# Patient Record
Sex: Female | Born: 2000 | Race: Black or African American | Hispanic: No | Marital: Single | Smoking: Never smoker
Health system: Southern US, Community
[De-identification: ages and names within clinical notes are randomized; demographics above are authoritative.]

## PROBLEM LIST (undated history)

## (undated) ENCOUNTER — Inpatient Hospital Stay (HOSPITAL_COMMUNITY): Payer: Self-pay

## (undated) DIAGNOSIS — L0291 Cutaneous abscess, unspecified: Secondary | ICD-10-CM

## (undated) DIAGNOSIS — E109 Type 1 diabetes mellitus without complications: Secondary | ICD-10-CM

## (undated) DIAGNOSIS — E119 Type 2 diabetes mellitus without complications: Secondary | ICD-10-CM

## (undated) HISTORY — PX: CHOLECYSTECTOMY: SHX55

---
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
## 2022-04-06 ENCOUNTER — Encounter (INDEPENDENT_AMBULATORY_CARE_PROVIDER_SITE_OTHER): Payer: Self-pay

## 2022-09-04 ENCOUNTER — Encounter (HOSPITAL_COMMUNITY): Payer: Self-pay | Admitting: Emergency Medicine

## 2022-09-04 ENCOUNTER — Emergency Department (HOSPITAL_COMMUNITY)
Admission: EM | Admit: 2022-09-04 | Discharge: 2022-09-04 | Disposition: A | Discharge status: Home / Self Care | Payer: Medicaid Other | Attending: Emergency Medicine | Admitting: Emergency Medicine

## 2022-09-04 ENCOUNTER — Other Ambulatory Visit: Payer: Self-pay

## 2022-09-04 ENCOUNTER — Emergency Department (HOSPITAL_COMMUNITY): Payer: Medicaid Other

## 2022-09-04 DIAGNOSIS — O209 Hemorrhage in early pregnancy, unspecified: Secondary | ICD-10-CM | POA: Diagnosis not present

## 2022-09-04 DIAGNOSIS — J45909 Unspecified asthma, uncomplicated: Secondary | ICD-10-CM | POA: Diagnosis not present

## 2022-09-04 DIAGNOSIS — E119 Type 2 diabetes mellitus without complications: Secondary | ICD-10-CM | POA: Diagnosis not present

## 2022-09-04 DIAGNOSIS — O039 Complete or unspecified spontaneous abortion without complication: Secondary | ICD-10-CM | POA: Insufficient documentation

## 2022-09-04 DIAGNOSIS — O3480 Maternal care for other abnormalities of pelvic organs, unspecified trimester: Secondary | ICD-10-CM | POA: Diagnosis not present

## 2022-09-04 DIAGNOSIS — Z3A00 Weeks of gestation of pregnancy not specified: Secondary | ICD-10-CM | POA: Diagnosis not present

## 2022-09-04 LAB — CBC
HCT: 35.9 % — ABNORMAL LOW (ref 36.0–46.0)
Hemoglobin: 11.3 g/dL — ABNORMAL LOW (ref 12.0–15.0)
MCH: 24.9 pg — ABNORMAL LOW (ref 26.0–34.0)
MCHC: 31.5 g/dL (ref 30.0–36.0)
MCV: 79.2 fL — ABNORMAL LOW (ref 80.0–100.0)
Platelets: 423 10*3/uL — ABNORMAL HIGH (ref 150–400)
RBC: 4.53 MIL/uL (ref 3.87–5.11)
RDW: 13.8 % (ref 11.5–15.5)
WBC: 10.6 10*3/uL — ABNORMAL HIGH (ref 4.0–10.5)
nRBC: 0 % (ref 0.0–0.2)

## 2022-09-04 LAB — BASIC METABOLIC PANEL
Anion gap: 11 (ref 5–15)
BUN: <5 mg/dL — ABNORMAL LOW (ref 6–20)
CO2: 22 mmol/L (ref 22–32)
Calcium: 9.1 mg/dL (ref 8.9–10.3)
Chloride: 100 mmol/L (ref 98–111)
Creatinine, Ser: 0.56 mg/dL (ref 0.44–1.00)
GFR, Estimated: >60 mL/min (ref >60)
Glucose, Bld: 251 mg/dL — ABNORMAL HIGH (ref 70–99)
Potassium: 3.7 mmol/L (ref 3.5–5.1)
Sodium: 133 mmol/L — ABNORMAL LOW (ref 135–145)

## 2022-09-04 LAB — HCG, SERUM, QUALITATIVE: Preg, Serum: POSITIVE — AB

## 2022-09-04 LAB — WET PREP, GENITAL
Clue Cells Wet Prep HPF POC: NONE SEEN
Sperm: NONE SEEN
Trich, Wet Prep: NONE SEEN
WBC, Wet Prep HPF POC: <10 (ref <10)
Yeast Wet Prep HPF POC: NONE SEEN

## 2022-09-04 LAB — HIV ANTIBODY (ROUTINE TESTING W REFLEX): HIV Screen 4th Generation wRfx: NONREACTIVE

## 2022-09-04 LAB — HCG, QUANTITATIVE, PREGNANCY: hCG, Beta Chain, Quant, S: 5797 m[IU]/mL — ABNORMAL HIGH (ref <5)

## 2022-09-04 LAB — ABO/RH: ABO/RH(D): B POS

## 2022-09-04 MED ORDER — OXYCODONE-ACETAMINOPHEN 5-325 MG PO TABS
1.0000 | ORAL_TABLET | Freq: Once | ORAL | Status: AC
  Administered 2022-09-04: 1 via ORAL
  Filled 2022-09-04: qty 1

## 2022-09-04 MED ORDER — MORPHINE SULFATE (PF) 4 MG/ML IV SOLN: 4.0000 mg | Freq: Once | INTRAVENOUS | Status: DC

## 2022-09-04 NOTE — ED Triage Notes (Signed)
Pt reports LMP 2 months ago. Pt having abd cramps today and took tylenol. Then went to bathroom and had BM. States she noticed bleeding and possibly had miscarriage. No pregnancy test done but she is normally regular.

## 2022-09-04 NOTE — Discharge Instructions (Signed)
You have symptoms likely due to a spontaneous abortion.  However, please follow-up with OB/GYN in 48 hours for recheck of your quantitative hCG.  Your current beta-hCG is 5797.  Take over-the-counter Tylenol as needed for pain.  Return if you have any concern.

## 2022-09-04 NOTE — ED Provider Notes (Signed)
[place] EMERGENCY DEPARTMENT AT [facility] Provider Note   CSN: [id number] Arrival date & time: 09/04/22  1800     History  Chief Complaint  Patient presents with   Vaginal Bleeding    Christine [name] is a 22 y.o. female.  The history is provided by the patient and medical records. No language interpreter was used.  Vaginal Bleeding    22 year old female significant history of diabetes, menorrhagia, asthma, presenting with complaints of vaginal bleeding.  Patient report this morning she developed some abdominal cramping.  Cramping was waxing waning throughout the day.  She had a bowel movement a few hours ago and she also noticed she is passing something abnormal from her vagina and after that her cramping did improved.  She mention her last menstrual period was approximately 2 months ago.  She has never been pregnant in the past.  She have noticed that she is urinating more frequent than usual but denies any burning urination no fever or chills no nausea vomiting or diarrhea.  She has been taking Tylenol throughout the day today for pain.  Home Medications Prior to Admission medications   Medication Sig Start Date End Date Taking? Authorizing Provider  ACCU-CHEK AVIVA PLUS test strip USE TO TEST SIX TIMES DAILY 01/31/21   [name], MD  BD PEN NEEDLE NANO 2ND GEN 32G X 4 MM MISC USE WITH INSULIN UP TO 6 TIMES DAILY 02/07/21   [name], MD  Clindamycin-Benzoyl Per, Refr, gel Apply twice daily as needed Patient not taking: Reported on 04/11/2021 02/02/21   Billey Co, MD  Continuous Blood Gluc Receiver (DEXCOM G6 RECEIVER) DEVI USE AS DIRECTED 03/10/19   [name], MD  Continuous Blood Gluc Sensor (DEXCOM G6 SENSOR) MISC Inject 1 applicator into the skin as directed. (change sensor every 10 days) 02/02/20   [name], MD  Continuous Blood Gluc Transmit (DEXCOM G6 TRANSMITTER) MISC USE AS DIRECTED TO TEST BLOOD SUGAR 02/07/21   [name], MD   doxycycline (VIBRA-TABS) 100 MG tablet Take 1 tablet (100 mg total) by mouth 2 (two) times daily. After 2 weeks, decrease the dose to once daily. 05/20/21   Lilland, Alana, DO  Glucagon (BAQSIMI TWO PACK) 3 MG/DOSE POWD Place 1 spray into the nose as directed. 02/02/20   [name], MD  VICTOZA 18 MG/3ML SOPN ADMINISTER 1.8 MG UNDER THE SKIN DAILY. START AT 0.6 MG DAILY AND. INCREASE AS DIRECTED TO. MAX TOLERATED DOSE Patient taking differently: Inject 1 mg into the skin daily. 11/15/20   [name], MD      Allergies    Patient has no known allergies.    Review of Systems   Review of Systems  Genitourinary:  Positive for vaginal bleeding.  All other systems reviewed and are negative.   Physical Exam Updated Vital Signs BP (!) 141/90 (BP Location: Left Arm)   Pulse 66   Temp 98.1 F (36.7 C) (Oral)   Resp 18   Ht 5\' 6"  (1.676 m)   Wt 82 kg   LMP 06/23/2022 (Approximate)   SpO2 100%   BMI 29.18 kg/m  Physical Exam Vitals and nursing note reviewed.  Constitutional:      General: She is not in acute distress.    Appearance: She is well-developed.  HENT:     Head: Atraumatic.  Eyes:     Conjunctiva/sclera: Conjunctivae normal.  Cardiovascular:     Rate and Rhythm: Normal rate and regular rhythm.     Pulses:  Normal pulses.     Heart sounds: Normal heart sounds.  Pulmonary:     Effort: Pulmonary effort is normal.  Abdominal:     Palpations: Abdomen is soft.     Tenderness: There is no abdominal tenderness.  Musculoskeletal:     Cervical back: Neck supple.  Skin:    Findings: No rash.  Neurological:     Mental Status: She is alert.  Psychiatric:        Mood and Affect: Mood normal.     ED Results / Procedures / Treatments   Labs (all labs ordered are listed, but only abnormal results are displayed) Labs Reviewed  HCG, SERUM, QUALITATIVE - Abnormal; Notable for the following components:      Result Value   Preg, Serum POSITIVE (*)    All other  components within normal limits  CBC - Abnormal; Notable for the following components:   WBC 10.6 (*)    Hemoglobin 11.3 (*)    HCT 35.9 (*)    MCV 79.2 (*)    MCH 24.9 (*)    Platelets 423 (*)    All other components within normal limits  BASIC METABOLIC PANEL - Abnormal; Notable for the following components:   Sodium 133 (*)    Glucose, Bld 251 (*)    BUN <5 (*)    All other components within normal limits  HCG, QUANTITATIVE, PREGNANCY - Abnormal; Notable for the following components:   hCG, Beta Chain, Quant, S 5,797 (*)    All other components within normal limits  WET PREP, GENITAL  RPR  HIV ANTIBODY (ROUTINE TESTING W REFLEX)  ABO/RH  GC/CHLAMYDIA PROBE AMP ([place]) NOT AT [facility]    EKG None  Radiology US OB Comp < 14 Wks  Result Date: 09/04/2022 CLINICAL DATA:  Vaginal bleeding EXAM: OBSTETRIC <14 WK [place] AND TRANSVAGINAL OB US TECHNIQUE: Both transabdominal and transvaginal ultrasound examinations were performed for complete evaluation of the gestation as well as the maternal uterus, adnexal regions, and pelvic cul-de-sac. Transvaginal technique was performed to assess early pregnancy. COMPARISON:  None Available. FINDINGS: Intrauterine gestational sac: No intrauterine gestational sac identified. Maternal uterus/adnexae: The uterus is anteverted. The cervix is closed and is unremarkable. No intrauterine masses are seen. The endometrium is uniform measuring 10 mm in thickness. The maternal ovaries are normal with a probable corpus luteum within the right ovary. No adnexal masses are seen. Trace simple appearing free fluid within the cul-de-sac. IMPRESSION: Pregnancy location not visualized sonographically. Differential diagnosis includes recent spontaneous abortion, IUP too early to visualize, and non-visualized ectopic pregnancy. Recommend close follow up of quantitative B-HCG levels, and follow up US as clinically warranted. Electronically Signed   By: Helyn Numbers M.D.   On:  09/04/2022 20:50   US OB Transvaginal  Result Date: 09/04/2022 CLINICAL DATA:  Vaginal bleeding EXAM: OBSTETRIC <14 WK [place] AND TRANSVAGINAL OB US TECHNIQUE: Both transabdominal and transvaginal ultrasound examinations were performed for complete evaluation of the gestation as well as the maternal uterus, adnexal regions, and pelvic cul-de-sac. Transvaginal technique was performed to assess early pregnancy. COMPARISON:  None Available. FINDINGS: Intrauterine gestational sac: No intrauterine gestational sac identified. Maternal uterus/adnexae: The uterus is anteverted. The cervix is closed and is unremarkable. No intrauterine masses are seen. The endometrium is uniform measuring 10 mm in thickness. The maternal ovaries are normal with a probable corpus luteum within the right ovary. No adnexal masses are seen. Trace simple appearing free fluid within the cul-de-sac. IMPRESSION: Pregnancy location not  visualized sonographically. Differential diagnosis includes recent spontaneous abortion, IUP too early to visualize, and non-visualized ectopic pregnancy. Recommend close follow up of quantitative B-HCG levels, and follow up US as clinically warranted. Electronically Signed   By: Helyn Numbers M.D.   On: 09/04/2022 20:50    Procedures Pelvic exam  Date/Time: 09/04/2022 7:45 PM  Performed by: Fayrene Helper, PA-C Authorized by: Fayrene Helper, PA-C  Comments: Chaperone present during exam.  No inguinal lymphadenopathy or inguinal hernia noted.  Normal external genitalia.  Mild discomfort with speculum insertion.  Moderate amount of vaginal bleeding as well as prior to conception noted in vaginal vault.  Close cervical os free of lesion or rash no laceration noted.  On bimanual exam no adnexal tenderness or cervical motion tenderness.       Medications Ordered in ED Medications  morphine (PF) 4 MG/ML injection 4 mg (4 mg Intravenous Not Given 09/04/22 2055)  oxyCODONE-acetaminophen (PERCOCET/ROXICET) 5-325 MG per  tablet 1 tablet (1 tablet Oral Given 09/04/22 2109)    ED Course/ Medical Decision Making/ A&P                             Medical Decision Making Amount and/or Complexity of Data Reviewed Labs: ordered. Radiology: ordered.  Risk Prescription drug management.   BP (!) 141/90 (BP Location: Left Arm)   Pulse 66   Temp 98.1 F (36.7 C) (Oral)   Resp 18   Ht 5\' 6"  (1.676 m)   Wt 82 kg   LMP 06/23/2022 (Approximate)   SpO2 100%   BMI 29.18 kg/m   52:2 PM  22 year old female significant history of diabetes, menorrhagia, asthma, presenting with complaints of vaginal bleeding.  Patient report this morning she developed some abdominal cramping.  Cramping was waxing waning throughout the day.  She had a bowel movement a few hours ago and she also noticed she is passing something abnormal from her vagina and after that her cramping did improved.  She mention her last menstrual period was approximately 2 months ago.  She has never been pregnant in the past.  She have noticed that she is urinating more frequent than usual but denies any burning urination no fever or chills no nausea vomiting or diarrhea.  She has been taking Tylenol throughout the day today for pain.  On exam, this is a well-appearing female resting comfortably in the bed appears to be in no acute discomfort.  Heart with normal rate and rhythm, lungs are clear to auscultation bilaterally abdomen is soft and nontender  Vital signs overall reassuring no fever no hypoxia.  -Labs ordered, independently viewed and interpreted by me.  Labs remarkable for hCG 5,797, hgb 11.3 -The patient was maintained on a cardiac monitor.  I personally viewed and interpreted the cardiac monitored which showed an underlying rhythm of: NSR -Imaging independently viewed and interpreted by me and I agree with radiologist's interpretation.  Result remarkable for transvaginal US showing pregnancy location not visualited suggestive recent spontaneous  abortion, IUP too early to visualize or non visualized ectopic pregnancy -This patient presents to the ED for concern of vaginal bleeding, this involves an extensive number of treatment options, and is a complaint that carries with it a high risk of complications and morbidity.  The differential diagnosis includes miscarriage, abortion, ectopic pregnancy, pregnancy, PID, cervicitis -Co morbidities that complicate the patient evaluation includes none -Treatment includes percocet -Reevaluation of the patient after these medicines showed that the patient  improved -PCP office notes or outside notes reviewed -Escalation to admission/observation considered: patients feels much better, is comfortable with discharge, and will follow up with OBGYN in 48 hrs -Prescription medication considered, patient comfortable with OTC meds -Social Determinant of Health considered   9:23 PM In the presence of product of conception, pelvic exam and ultrasound did not shows any presentations of intrauterine pregnancy, I suspect this is likely to be a spontaneous abortion.  I however recommend patient to follow-up closely with OB/GYN in the next 48 hours for a recheck and to have a follow-up quantitative hCG as clinically warranted.  Patient otherwise stable for discharge.  Return precaution given.         Final Clinical Impression(s) / ED Diagnoses Final diagnoses:  Spontaneous abortion    Rx / DC Orders ED Discharge Orders     None         Fayrene Helper, PA-C 09/04/22 2125    Linwood Dibbles, MD 09/05/22 1521

## 2022-09-05 LAB — GC/CHLAMYDIA PROBE AMP (~~LOC~~) NOT AT ARMC
Chlamydia: POSITIVE — AB
Comment: NEGATIVE
Comment: NORMAL
Neisseria Gonorrhea: NEGATIVE

## 2022-09-05 LAB — RPR: RPR Ser Ql: NONREACTIVE

## 2022-09-06 ENCOUNTER — Ambulatory Visit: Payer: Medicaid Other

## 2022-09-08 ENCOUNTER — Encounter (INDEPENDENT_AMBULATORY_CARE_PROVIDER_SITE_OTHER): Payer: Self-pay

## 2022-09-08 ENCOUNTER — Telehealth (HOSPITAL_COMMUNITY): Payer: Self-pay

## 2022-09-08 MED ORDER — AZITHROMYCIN 500 MG PO TABS: 1000.0000 mg | ORAL_TABLET | Freq: Once | ORAL | 0 refills | Status: AC

## 2022-09-11 ENCOUNTER — Encounter: Payer: Medicaid Other | Admitting: Family Medicine

## 2022-09-11 NOTE — Progress Notes (Deleted)
GYNECOLOGY OFFICE VISIT NOTE  History:   Christine [name] is a 22 y.o. No obstetric history on file. here today for ***. She denies any abnormal vaginal discharge, bleeding, pelvic pain or other concerns.    Past Medical History:  Diagnosis Date   Abscess    Asthma     Past Surgical History:  Procedure Laterality Date   ARM WOUND REPAIR / CLOSURE Right 12/2019   Right upper arm     The following portions of the patient's history were reviewed and updated as appropriate: allergies, current medications, past family history, past medical history, past social history, past surgical history and problem list.   Health Maintenance:  Normal pap and negative HRHPV on ***.  Normal mammogram on ***.   Review of Systems:  Pertinent items noted in HPI and remainder of comprehensive ROS otherwise negative.  Physical Exam:  LMP 06/23/2022 (Approximate)  General: Appears well, no acute distress. Age appropriate. Cardiac: RRR, normal heart sounds, no murmurs Respiratory: CTAB, normal effort Abdomen: soft, nontender, nondistended Extremities: No edema or cyanosis. Skin: Warm and dry, no rashes noted Neuro: alert and oriented, no focal deficits Psych: normal affect Pelvic: {Blank single:19197::"Deferred","Normal appearing external genitalia; normal urethral meatus; normal appearing vaginal mucosa and cervix.  No abnormal discharge noted.  Normal uterine size, no other palpable masses, no uterine or adnexal tenderness. Performed in the presence of a chaperone"}  Labs and Imaging Results for orders placed or performed during the hospital encounter of 09/04/22 (from the past 168 hour(s))  hCG, serum, qualitative   Collection Time: 09/04/22  6:33 PM  Result Value Ref Range   Preg, Serum POSITIVE (A) NEGATIVE  CBC   Collection Time: 09/04/22  6:33 PM  Result Value Ref Range   WBC 10.6 (H) 4.0 - 10.5 K/uL   RBC 4.53 3.87 - 5.11 MIL/uL   Hemoglobin 11.3 (L) 12.0 - 15.0 g/dL   HCT 16.1 (L) 09.6  - 46.0 %   MCV 79.2 (L) 80.0 - 100.0 fL   MCH 24.9 (L) 26.0 - 34.0 pg   MCHC 31.5 30.0 - 36.0 g/dL   RDW 04.5 40.9 - 81.1 %   Platelets 423 (H) 150 - 400 K/uL   nRBC 0.0 0.0 - 0.2 %  Basic metabolic panel   Collection Time: 09/04/22  6:33 PM  Result Value Ref Range   Sodium 133 (L) 135 - 145 mmol/L   Potassium 3.7 3.5 - 5.1 mmol/L   Chloride 100 98 - 111 mmol/L   CO2 22 22 - 32 mmol/L   Glucose, Bld 251 (H) 70 - 99 mg/dL   BUN <5 (L) 6 - 20 mg/dL   Creatinine, Ser 9.14 0.44 - 1.00 mg/dL   Calcium 9.1 8.9 - 78.2 mg/dL   GFR, Estimated >95 >62 mL/min   Anion gap 11 5 - 15  GC/Chlamydia probe amp   Collection Time: 09/04/22  7:23 PM  Result Value Ref Range   Neisseria Gonorrhea Negative    Chlamydia Positive (A)    Comment Normal Reference [place] Chlamydia - Negative    Comment      Normal Reference Range Neisseria Gonorrhea - Negative  RPR   Collection Time: 09/04/22  7:36 PM  Result Value Ref Range   RPR Ser Ql NON REACTIVE NON REACTIVE  HIV Antibody (routine testing w rflx)   Collection Time: 09/04/22  7:36 PM  Result Value Ref Range   HIV Screen 4th Generation wRfx Non Reactive Non Reactive  hCG, quantitative,  pregnancy   Collection Time: 09/04/22  7:36 PM  Result Value Ref Range   hCG, Beta Chain, Quant, S 5,797 (H) <5 mIU/mL  ABO/Rh   Collection Time: 09/04/22  7:36 PM  Result Value Ref Range   ABO/RH(D)      B POS Performed at [facility], [address], [place], [place] [address]   Wet prep, genital   Collection Time: 09/04/22  7:49 PM   Specimen: PATH Cytology Cervicovaginal Ancillary Only  Result Value Ref Range   Yeast Wet Prep HPF POC NONE SEEN NONE SEEN   Trich, Wet Prep NONE SEEN NONE SEEN   Clue Cells Wet Prep HPF POC NONE SEEN NONE SEEN   WBC, Wet Prep HPF POC <10 <10   Sperm NONE SEEN    US OB Comp < 14 Wks  Result Date: 09/04/2022 CLINICAL DATA:  Vaginal bleeding EXAM: OBSTETRIC <14 WK [place] AND TRANSVAGINAL OB US TECHNIQUE:  Both transabdominal and transvaginal ultrasound examinations were performed for complete evaluation of the gestation as well as the maternal uterus, adnexal regions, and pelvic cul-de-sac. Transvaginal technique was performed to assess early pregnancy. COMPARISON:  None Available. FINDINGS: Intrauterine gestational sac: No intrauterine gestational sac identified. Maternal uterus/adnexae: The uterus is anteverted. The cervix is closed and is unremarkable. No intrauterine masses are seen. The endometrium is uniform measuring 10 mm in thickness. The maternal ovaries are normal with a probable corpus luteum within the right ovary. No adnexal masses are seen. Trace simple appearing free fluid within the cul-de-sac. IMPRESSION: Pregnancy location not visualized sonographically. Differential diagnosis includes recent spontaneous abortion, IUP too early to visualize, and non-visualized ectopic pregnancy. Recommend close follow up of quantitative B-HCG levels, and follow up US as clinically warranted. Electronically Signed   By: Helyn Numbers M.D.   On: 09/04/2022 20:50   US OB Transvaginal  Result Date: 09/04/2022 CLINICAL DATA:  Vaginal bleeding EXAM: OBSTETRIC <14 WK [place] AND TRANSVAGINAL OB US TECHNIQUE: Both transabdominal and transvaginal ultrasound examinations were performed for complete evaluation of the gestation as well as the maternal uterus, adnexal regions, and pelvic cul-de-sac. Transvaginal technique was performed to assess early pregnancy. COMPARISON:  None Available. FINDINGS: Intrauterine gestational sac: No intrauterine gestational sac identified. Maternal uterus/adnexae: The uterus is anteverted. The cervix is closed and is unremarkable. No intrauterine masses are seen. The endometrium is uniform measuring 10 mm in thickness. The maternal ovaries are normal with a probable corpus luteum within the right ovary. No adnexal masses are seen. Trace simple appearing free fluid within the cul-de-sac.  IMPRESSION: Pregnancy location not visualized sonographically. Differential diagnosis includes recent spontaneous abortion, IUP too early to visualize, and non-visualized ectopic pregnancy. Recommend close follow up of quantitative B-HCG levels, and follow up US as clinically warranted. Electronically Signed   By: Helyn Numbers M.D.   On: 09/04/2022 20:50      Assessment and Plan:    There are no diagnoses linked to this encounter.  Routine preventative health maintenance measures emphasized. Please refer to After Visit Summary for other counseling recommendations.   No follow-ups on file.    Lavonda Jumbo, DO OB Fellow, Faculty [facility], Center for [facility] 09/11/2022, 1:10 PM

## 2022-09-12 ENCOUNTER — Emergency Department (HOSPITAL_COMMUNITY)
Admission: EM | Admit: 2022-09-12 | Discharge: 2022-09-12 | Disposition: A | Discharge status: Home / Self Care | Payer: Medicaid Other | Attending: Emergency Medicine | Admitting: Emergency Medicine

## 2022-09-12 ENCOUNTER — Emergency Department (HOSPITAL_COMMUNITY): Payer: Medicaid Other

## 2022-09-12 ENCOUNTER — Encounter (HOSPITAL_COMMUNITY): Payer: Self-pay

## 2022-09-12 DIAGNOSIS — R739 Hyperglycemia, unspecified: Secondary | ICD-10-CM | POA: Insufficient documentation

## 2022-09-12 DIAGNOSIS — O039 Complete or unspecified spontaneous abortion without complication: Secondary | ICD-10-CM | POA: Diagnosis not present

## 2022-09-12 DIAGNOSIS — I10 Essential (primary) hypertension: Secondary | ICD-10-CM | POA: Diagnosis not present

## 2022-09-12 DIAGNOSIS — K8050 Calculus of bile duct without cholangitis or cholecystitis without obstruction: Secondary | ICD-10-CM

## 2022-09-12 DIAGNOSIS — N939 Abnormal uterine and vaginal bleeding, unspecified: Secondary | ICD-10-CM | POA: Diagnosis not present

## 2022-09-12 DIAGNOSIS — J45909 Unspecified asthma, uncomplicated: Secondary | ICD-10-CM | POA: Diagnosis not present

## 2022-09-12 DIAGNOSIS — K8070 Calculus of gallbladder and bile duct without cholecystitis without obstruction: Secondary | ICD-10-CM | POA: Insufficient documentation

## 2022-09-12 DIAGNOSIS — K8020 Calculus of gallbladder without cholecystitis without obstruction: Secondary | ICD-10-CM | POA: Diagnosis not present

## 2022-09-12 DIAGNOSIS — R1011 Right upper quadrant pain: Secondary | ICD-10-CM | POA: Diagnosis not present

## 2022-09-12 DIAGNOSIS — R1084 Generalized abdominal pain: Secondary | ICD-10-CM | POA: Diagnosis not present

## 2022-09-12 DIAGNOSIS — R58 Hemorrhage, not elsewhere classified: Secondary | ICD-10-CM | POA: Diagnosis not present

## 2022-09-12 LAB — URINALYSIS, ROUTINE W REFLEX MICROSCOPIC
Bacteria, UA: NONE SEEN
Bilirubin Urine: NEGATIVE
Glucose, UA: >=500 mg/dL — AB
Hgb urine dipstick: NEGATIVE
Ketones, ur: NEGATIVE mg/dL
Nitrite: NEGATIVE
Protein, ur: NEGATIVE mg/dL
Specific Gravity, Urine: 1.021 (ref 1.005–1.030)
pH: 6 (ref 5.0–8.0)

## 2022-09-12 LAB — CBC
HCT: 35.2 % — ABNORMAL LOW (ref 36.0–46.0)
Hemoglobin: 10.9 g/dL — ABNORMAL LOW (ref 12.0–15.0)
MCH: 24.6 pg — ABNORMAL LOW (ref 26.0–34.0)
MCHC: 31 g/dL (ref 30.0–36.0)
MCV: 79.5 fL — ABNORMAL LOW (ref 80.0–100.0)
Platelets: 418 10*3/uL — ABNORMAL HIGH (ref 150–400)
RBC: 4.43 MIL/uL (ref 3.87–5.11)
RDW: 13.4 % (ref 11.5–15.5)
WBC: 8.2 10*3/uL (ref 4.0–10.5)
nRBC: 0 % (ref 0.0–0.2)

## 2022-09-12 LAB — COMPREHENSIVE METABOLIC PANEL
ALT: 16 U/L (ref 0–44)
AST: 18 U/L (ref 15–41)
Albumin: 3.7 g/dL (ref 3.5–5.0)
Alkaline Phosphatase: 59 U/L (ref 38–126)
Anion gap: 8 (ref 5–15)
BUN: <5 mg/dL — ABNORMAL LOW (ref 6–20)
CO2: 24 mmol/L (ref 22–32)
Calcium: 8.8 mg/dL — ABNORMAL LOW (ref 8.9–10.3)
Chloride: 104 mmol/L (ref 98–111)
Creatinine, Ser: 0.63 mg/dL (ref 0.44–1.00)
GFR, Estimated: >60 mL/min (ref >60)
Glucose, Bld: 318 mg/dL — ABNORMAL HIGH (ref 70–99)
Potassium: 4.3 mmol/L (ref 3.5–5.1)
Sodium: 136 mmol/L (ref 135–145)
Total Bilirubin: 0.6 mg/dL (ref 0.3–1.2)
Total Protein: 7.2 g/dL (ref 6.5–8.1)

## 2022-09-12 LAB — LIPASE, BLOOD: Lipase: 42 U/L (ref 11–51)

## 2022-09-12 LAB — HCG, QUANTITATIVE, PREGNANCY: hCG, Beta Chain, Quant, S: 111 m[IU]/mL — ABNORMAL HIGH (ref <5)

## 2022-09-12 MED ORDER — LIDOCAINE-EPINEPHRINE (PF) 2 %-1:200000 IJ SOLN
INTRAMUSCULAR | Status: AC
  Filled 2022-09-12: qty 20

## 2022-09-12 MED ORDER — OXYCODONE HCL 5 MG PO TABS
10.0000 mg | ORAL_TABLET | Freq: Once | ORAL | Status: AC
  Administered 2022-09-12: 10 mg via ORAL
  Filled 2022-09-12 (×2): qty 2

## 2022-09-12 NOTE — ED Notes (Signed)
Lab add on for quant

## 2022-09-12 NOTE — ED Triage Notes (Addendum)
Per [facility], Pt, from home, c/o R sided abdominal pain and low back pain starting this morning.  Pain score 5/10.  Denies n/v/d.  Denies vaginal bleeding and dysuria.  Pt reports having a spontaneous miscarriage x1 week ago.

## 2022-09-12 NOTE — ED Provider Notes (Signed)
[place] EMERGENCY DEPARTMENT AT [facility] Provider Note   CSN: [id number] Arrival date & time: 09/12/22  2952     History  Chief Complaint  Patient presents with   Abdominal Pain    Christine [name] is a 22 y.o. female.   Abdominal Pain Patient with abdominal pain.  Right upper quadrant.  Crampy pain.  Did have a miscarriage last week.  States the pain however is in the upper abdomen now.  No fevers or chills.  No dysuria.  States vaginal bleeding is decreased.  Not associate with food but has not eaten today.    Past Medical History:  Diagnosis Date   Abscess    Asthma     Home Medications Prior to Admission medications   Medication Sig Start Date End Date Taking? Authorizing Provider  ACCU-CHEK AVIVA PLUS test strip USE TO TEST SIX TIMES DAILY 01/31/21   [name], MD  BD PEN NEEDLE NANO 2ND GEN 32G X 4 MM MISC USE WITH INSULIN UP TO 6 TIMES DAILY 02/07/21   [name], MD  Clindamycin-Benzoyl Per, Refr, gel Apply twice daily as needed Patient not taking: Reported on 04/11/2021 02/02/21   Billey Co, MD  Continuous Blood Gluc Receiver (DEXCOM G6 RECEIVER) DEVI USE AS DIRECTED 03/10/19   [name], MD  Continuous Blood Gluc Sensor (DEXCOM G6 SENSOR) MISC Inject 1 applicator into the skin as directed. (change sensor every 10 days) 02/02/20   [name], MD  Continuous Blood Gluc Transmit (DEXCOM G6 TRANSMITTER) MISC USE AS DIRECTED TO TEST BLOOD SUGAR 02/07/21   [name], MD  doxycycline (VIBRA-TABS) 100 MG tablet Take 1 tablet (100 mg total) by mouth 2 (two) times daily. After 2 weeks, decrease the dose to once daily. 05/20/21   Lilland, Alana, DO  Glucagon (BAQSIMI TWO PACK) 3 MG/DOSE POWD Place 1 spray into the nose as directed. 02/02/20   [name], MD  VICTOZA 18 MG/3ML SOPN ADMINISTER 1.8 MG UNDER THE SKIN DAILY. START AT 0.6 MG DAILY AND. INCREASE AS DIRECTED TO. MAX TOLERATED DOSE Patient taking differently: Inject 1 mg  into the skin daily. 11/15/20   [name], MD      Allergies    Patient has no known allergies.    Review of Systems   Review of Systems  Gastrointestinal:  Positive for abdominal pain.    Physical Exam Updated Vital Signs BP 122/86 (BP Location: Left Arm)   Pulse 80   Temp 98 F (36.7 C) (Oral)   Resp 18   Ht 5\' 6"  (1.676 m)   Wt 81.6 kg   LMP 06/23/2022 (Approximate)   SpO2 100%   BMI 29.05 kg/m  Physical Exam Vitals and nursing note reviewed.  Pulmonary:     Breath sounds: Normal breath sounds.  Abdominal:     Comments: Mild right upper quadrant tenderness without rebound or guarding.  No hernia palpated.  No lower abdominal or suprapubic tenderness.  Neurological:     Mental Status: She is alert.     ED Results / Procedures / Treatments   Labs (all labs ordered are listed, but only abnormal results are displayed) Labs Reviewed  COMPREHENSIVE METABOLIC PANEL - Abnormal; Notable for the following components:      Result Value   Glucose, Bld 318 (*)    BUN <5 (*)    Calcium 8.8 (*)    All other components within normal limits  CBC - Abnormal; Notable for the following components:  Hemoglobin 10.9 (*)    HCT 35.2 (*)    MCV 79.5 (*)    MCH 24.6 (*)    Platelets 418 (*)    All other components within normal limits  URINALYSIS, ROUTINE W REFLEX MICROSCOPIC - Abnormal; Notable for the following components:   Color, Urine STRAW (*)    Glucose, UA >=500 (*)    Leukocytes,Ua SMALL (*)    All other components within normal limits  HCG, QUANTITATIVE, PREGNANCY - Abnormal; Notable for the following components:   hCG, Beta Chain, Quant, S 111 (*)    All other components within normal limits  LIPASE, BLOOD    EKG None  Radiology US Abdomen Limited RUQ (LIVER/GB)  Result Date: 09/12/2022 CLINICAL DATA:  Right upper quadrant pain EXAM: ULTRASOUND ABDOMEN LIMITED RIGHT UPPER QUADRANT COMPARISON:  None Available. FINDINGS: Gallbladder: Numerous layering  gallstones, measuring up to 8 mm. No gallbladder wall thickening or pericholecystic fluid. Negative sonographic Murphy's sign. Common bile duct: Diameter: 2 mm Liver: No focal lesion identified. Within normal limits in parenchymal echogenicity. Portal vein is patent on color Doppler imaging with normal direction of blood flow towards the liver. Other: None. IMPRESSION: Cholelithiasis without evidence of acute cholecystitis. Electronically Signed   By: Jacob Moores M.D.   On: 09/12/2022 11:42    Procedures Procedures    Medications Ordered in ED Medications  oxyCODONE (Oxy IR/ROXICODONE) immediate release tablet 10 mg (10 mg Oral Given 09/12/22 1135)    ED Course/ Medical Decision Making/ A&P                             Medical Decision Making Amount and/or Complexity of Data Reviewed Labs: ordered.   Patient upper abdominal pain.  Began last night.  We did have recent miscarriage but this pain is more upper abdomen.  Basic blood work done and white count reassuring.  Hemoglobin reassuring.  LFTs reassuring.  Will add quantitative hCG trend from a week ago.  Will get ultrasound of right upper quadrant  Ultrasound does show gallstones without cholecystitis.  Blood work reassuring.  hCG appropriately decreasing.  Does have hyperglycemia but had been off her medicines but is restarted.  History of diabetes.  Will give general surgery information for follow-up for her biliary colic.  Does not appear to be cholecystitis at this time.        Final Clinical Impression(s) / ED Diagnoses Final diagnoses:  Biliary colic    Rx / DC Orders ED Discharge Orders     None         Benjiman Core, MD 09/12/22 1510

## 2022-09-12 NOTE — ED Notes (Signed)
Cracker provided

## 2022-09-12 NOTE — ED Provider Triage Note (Signed)
Emergency Medicine Provider Triage Evaluation Note  Christine [name] , a 22 y.o. female  was evaluated in triage.  Pt complains of RUQ abdominal pain.  Review of Systems  Positive: Abdominal pain Negative: Nausea, vomiting, CP, SOB, fevers  Physical Exam  BP 104/89 (BP Location: Right Arm)   Pulse 83   Temp 97.7 F (36.5 C) (Oral)   Resp 18   Ht 5\' 6"  (1.676 m)   Wt 81.6 kg   LMP 06/23/2022 (Approximate)   SpO2 100%   BMI 29.05 kg/m  Gen:   Awake, no distress   Resp:  Normal effort  MSK:   Moves extremities without difficulty  Abd:  TTP of the RUQ, no rebound, mild guarding, negative murphy's  Medical Decision Making  Medically screening exam initiated at 9:57 AM.  Appropriate orders placed.  Christine [name] was informed that the remainder of the evaluation will be completed by another provider, this initial triage assessment does not replace that evaluation, and the importance of remaining in the ED until their evaluation is complete.  22 year old female presenting to the emerged department with sudden onset right upper quadrant abdominal pain that woke her up this morning.  No nausea or vomiting.  Exam with right upper quadrant tenderness, negative Murphy sign.  Will obtain screening labs and right upper quadrant ultrasound.   Ernie Avena, MD 09/12/22 [phone]

## 2022-09-14 ENCOUNTER — Ambulatory Visit: Payer: Medicaid Other | Admitting: Student

## 2022-09-14 ENCOUNTER — Ambulatory Visit (INDEPENDENT_AMBULATORY_CARE_PROVIDER_SITE_OTHER): Payer: Medicaid Other | Admitting: Student

## 2022-09-14 VITALS — BP 130/85 | HR 82 | Ht 66.0 in | Wt 183.4 lb

## 2022-09-14 DIAGNOSIS — E11628 Type 2 diabetes mellitus with other skin complications: Secondary | ICD-10-CM

## 2022-09-14 LAB — POCT GLYCOSYLATED HEMOGLOBIN (HGB A1C): HbA1c, POC (controlled diabetic range): 11.9 % — AB (ref 0.0–7.0)

## 2022-09-14 MED ORDER — DEXCOM G7 SENSOR MISC
1 refills | Status: DC
Start: 2022-09-14 — End: 2022-09-15

## 2022-09-14 MED ORDER — INSULIN GLARGINE 100 UNIT/ML SOLOSTAR PEN
10.0000 [IU] | PEN_INJECTOR | Freq: Every morning | SUBCUTANEOUS | 1 refills | Status: DC
Start: 2022-09-14 — End: 2022-10-10

## 2022-09-14 MED ORDER — DEXCOM G7 RECEIVER DEVI
1.0000 [IU] | Freq: Every day | 0 refills | Status: AC
Start: 2022-09-14 — End: ?

## 2022-09-14 NOTE — Patient Instructions (Addendum)
It was great to see you! Thank you for allowing me to participate in your care!   I recommend that you always bring your medications to each appointment as this makes it easy to ensure we are on the correct medications and helps [place] not miss when refills are needed.  Our plans for today:  - Please take 10 Units of Lantus every morning - Please check your glucose with CGM. If your sugars often remain above 250, we will need to increase medication - Follow-up on October 06, 2022 at 11:00 AM  We are checking some labs today, I will call you if they are abnormal will send you a MyChart message or a letter if they are normal.  If you do not hear about your labs in the next 2 weeks please let us know.  Take care and seek immediate care sooner if you develop any concerns. Please remember to show up 15 minutes before your scheduled appointment time!  Tiffany Kocher, DO [facility]

## 2022-09-14 NOTE — Progress Notes (Signed)
    SUBJECTIVE:   CHIEF COMPLAINT / HPI:   Diabetic Follow Up: Patient is a 22 year old female who presents today for diabetic follow-up.  Previous diagnosis of type 2 diabetes at age 7.  Used to follow with Dr. Geannie Risen pediatric endocrinology, however patient reports has not been seen in 2 years.  She states she was told to come off medication as her diabetes was well-controlled, however per further chart review this does not appear to be the case.  She has an intolerance to metformin, and was previously on high doses of insulin and was started on Victoza by pediatric endocrinology.  Over time she was able to wean off of insulin and be treated with Victoza alone.  Patient recently had ED visit for gallstones, and has appointment with surgery on July 26 to discuss cholecystectomy.  Patient is interested in having A1c checked today.  Prior medication regimen - Appears to have been on 50 U of NovoLog daily in 2022 - Appears to have previously been on 38 units of Lantus daily - Was up to 1.8 mg Victoza prior to discontinuation  Most recent A1Cs:  Lab Results  Component Value Date   HGBA1C 11.9 (A) 09/14/2022   HGBA1C 5.7 02/02/2021   HGBA1C 5.2 08/03/2020   Last Microalbumin, LDL, Creatinine: Lab Results  Component Value Date   MICROALBUR 30 mg 02/02/2021   LDLCALC 80 04/27/2020   CREATININE 0.63 09/12/2022   Patient does not check blood glucose on a regular basis.  Patient is not up to date on diabetic eye. Plans to call eye doctor. Patient is not up to date on diabetic foot exam.  PERTINENT  PMH / PSH: T2DM  OBJECTIVE:   BP 130/85   Pulse 82   Ht 5\' 6"  (1.676 m)   Wt 183 lb 6 oz (83.2 kg)   LMP 06/23/2022 (Approximate)   SpO2 100%   BMI 29.60 kg/m    General: NAD, pleasant Cardio: RRR, no MRG. Cap Refill <2s. Respiratory: CTAB, normal wob on RA Skin: Warm and dry  ASSESSMENT/PLAN:   Diabetes mellitus (HCC) A1c 11.9 today.  Asymptomatic today.  No medications for  approximately 2 years.  Reviewed prior medication management.  Recent history of gallstones, unable to prescribe GLP-1 at this time.  Will consider GLP-1 after patient has been seen by surgery for elective cholecystectomy. - UACR - 10 units of Lantus daily - Dexcom G7 - Hypoglycemia precautions discussed - Will schedule eye appointment - Will update diabetic foot exam at next visit - Follow-up in 2 weeks  Tiffany Kocher, DO [facility]

## 2022-09-14 NOTE — Assessment & Plan Note (Addendum)
A1c 11.9 today.  Asymptomatic today.  No medications for approximately 2 years.  Reviewed prior medication management.  Recent history of gallstones, unable to prescribe GLP-1 at this time.  Will consider GLP-1 after patient has been seen by surgery for elective cholecystectomy. - UACR - 10 units of Lantus daily - Dexcom G7 - Hypoglycemia precautions discussed - Will schedule eye appointment - Will update diabetic foot exam at next visit - Follow-up in 2 weeks

## 2022-09-15 ENCOUNTER — Other Ambulatory Visit: Payer: Self-pay | Admitting: Student

## 2022-09-15 ENCOUNTER — Telehealth: Payer: Self-pay

## 2022-09-15 ENCOUNTER — Other Ambulatory Visit (HOSPITAL_COMMUNITY): Payer: Self-pay

## 2022-09-15 DIAGNOSIS — E11628 Type 2 diabetes mellitus with other skin complications: Secondary | ICD-10-CM

## 2022-09-15 LAB — MICROALBUMIN / CREATININE URINE RATIO
Creatinine, Urine: 78.9 mg/dL
Microalb/Creat Ratio: <4 mg/g creat (ref 0–29)
Microalbumin, Urine: <3 ug/mL

## 2022-09-15 MED ORDER — DEXCOM G7 SENSOR MISC: 2 refills | Status: DC

## 2022-09-15 NOTE — Progress Notes (Signed)
Rx for Dexcom 7 sensor sent after PA approved.

## 2022-09-15 NOTE — Telephone Encounter (Signed)
Pharmacy Patient Advocate Encounter   Received notification from CoverMyMeds that prior authorization for Dexcom G7 Sensor is required/requested.  PA submitted to [facility] via CoverMyMeds Key/confirmation #/EOC [id number] Status is pending

## 2022-09-15 NOTE — Telephone Encounter (Signed)
Pharmacy Patient Advocate Encounter  Received notification from [facility] that Prior Authorization for Dexcom G7 Sensor has been APPROVED from 09/15/22 to 03/13/23.Marland Kitchen  PA #/Case ID/Reference #: [id number]  Please send rx for qty of 3. (Sensors: 3 each for 30 day supply)

## 2022-09-21 ENCOUNTER — Encounter (INDEPENDENT_AMBULATORY_CARE_PROVIDER_SITE_OTHER): Payer: Self-pay

## 2022-09-26 ENCOUNTER — Other Ambulatory Visit: Payer: Self-pay

## 2022-09-27 MED ORDER — BD PEN NEEDLE NANO 2ND GEN 32G X 4 MM MISC: 5 refills | Status: AC

## 2022-10-06 ENCOUNTER — Ambulatory Visit: Payer: Self-pay | Admitting: Student

## 2022-10-10 ENCOUNTER — Encounter: Payer: Self-pay | Admitting: Student

## 2022-10-10 ENCOUNTER — Ambulatory Visit: Payer: Medicaid Other | Admitting: Student

## 2022-10-10 DIAGNOSIS — E11628 Type 2 diabetes mellitus with other skin complications: Secondary | ICD-10-CM

## 2022-10-10 MED ORDER — INSULIN GLARGINE 100 UNIT/ML SOLOSTAR PEN
16.0000 [IU] | PEN_INJECTOR | Freq: Every morning | SUBCUTANEOUS | 1 refills | Status: DC
Start: 2022-10-10 — End: 2023-02-13

## 2022-10-10 NOTE — Patient Instructions (Signed)
It was great to see you! Thank you for allowing me to participate in your care!   I recommend that you always bring your medications to each appointment as this makes it easy to ensure we are on the correct medications and helps [place] not miss when refills are needed.  Our plans for today:  - take 16 U of lantus in the morning - Please stop your insulin and call the office if you experience blood sugars below 100  Take care and seek immediate care sooner if you develop any concerns. Please remember to show up 15 minutes before your scheduled appointment time!  Tiffany Kocher, DO [facility]

## 2022-10-10 NOTE — Progress Notes (Signed)
    SUBJECTIVE:   CHIEF COMPLAINT / HPI:   Type 2 diabetes Patient has Dexcom 7, has been wearing it.  Unfortunately, information was not sharing for the past 2 weeks.  Patient was given handout today, and that it would not be shared to our clinic.  Reports sugars remain greater than 200 consistently.  Currently on 10 units of Lantus daily.  Still needs to see eye doctor, however agreeable to foot exam today.  UACR checked at last visit, WNL.  PERTINENT  PMH / PSH: Asthma, hidradenitis  OBJECTIVE:   BP 121/87   Pulse 78   Ht 5\' 6"  (1.676 m)   Wt 184 lb 2 oz (83.5 kg)   LMP 06/12/2022   SpO2 100%   BMI 29.72 kg/m    General: NAD, pleasant Cardio: RRR, no MRG. Cap Refill <2s. Respiratory: CTAB, normal wob on RA Skin: Warm and dry  ASSESSMENT/PLAN:   Diabetes mellitus (HCC) Glucose readings >200 consistently with Dexcom, despite 10 units of Lantus daily.  No hypoglycemic events.  UACR WNL at last visit. Unable to start GLP-1, as patient has gallstones-surgery evaluation tomorrow. - Increase Lantus to 16 units daily - Updated foot exam - Encouraged ophthalmology appointment - Plan to follow-up by phone on Monday to assess dose increase - Plan for 1 month office follow-up  Tiffany Kocher, DO [facility]

## 2022-10-10 NOTE — Assessment & Plan Note (Addendum)
Glucose readings >200 consistently with Dexcom, despite 10 units of Lantus daily.  No hypoglycemic events.  UACR WNL at last visit. Unable to start GLP-1, as patient has gallstones-surgery evaluation tomorrow. - Increase Lantus to 16 units daily - Updated foot exam - Encouraged ophthalmology appointment - Plan to follow-up by phone on Monday to assess dose increase - Plan for 1 month office follow-up

## 2022-10-11 ENCOUNTER — Emergency Department (HOSPITAL_COMMUNITY): Payer: Medicaid Other

## 2022-10-11 ENCOUNTER — Encounter (HOSPITAL_COMMUNITY): Payer: Self-pay | Admitting: Emergency Medicine

## 2022-10-11 ENCOUNTER — Other Ambulatory Visit: Payer: Self-pay

## 2022-10-11 ENCOUNTER — Emergency Department (HOSPITAL_COMMUNITY)
Admission: EM | Admit: 2022-10-11 | Discharge: 2022-10-11 | Disposition: A | Discharge status: Home / Self Care | Payer: Medicaid Other | Attending: Emergency Medicine | Admitting: Emergency Medicine

## 2022-10-11 DIAGNOSIS — S93492A Sprain of other ligament of left ankle, initial encounter: Secondary | ICD-10-CM | POA: Diagnosis not present

## 2022-10-11 DIAGNOSIS — Z794 Long term (current) use of insulin: Secondary | ICD-10-CM | POA: Insufficient documentation

## 2022-10-11 DIAGNOSIS — M25572 Pain in left ankle and joints of left foot: Secondary | ICD-10-CM | POA: Insufficient documentation

## 2022-10-11 DIAGNOSIS — S93432A Sprain of tibiofibular ligament of left ankle, initial encounter: Secondary | ICD-10-CM | POA: Diagnosis not present

## 2022-10-11 MED ORDER — ACETAMINOPHEN 500 MG PO TABS
1000.0000 mg | ORAL_TABLET | Freq: Once | ORAL | Status: AC
  Administered 2022-10-11: 1000 mg via ORAL
  Filled 2022-10-11: qty 2

## 2022-10-11 MED ORDER — IBUPROFEN 200 MG PO TABS
600.0000 mg | ORAL_TABLET | Freq: Once | ORAL | Status: AC
  Administered 2022-10-11: 600 mg via ORAL
  Filled 2022-10-11: qty 3

## 2022-10-11 NOTE — ED Notes (Signed)
Pt wheeled to waiting room. Pt verbalized understanding of discharge instructions.   

## 2022-10-11 NOTE — ED Triage Notes (Signed)
Pt reports left ankle pain that started on Monday. Denies injury to the area. There is swelling noted to the the lateral ankle.

## 2022-10-11 NOTE — ED Provider Notes (Signed)
[place] EMERGENCY DEPARTMENT AT [facility] Provider Note   CSN: [id number] Arrival date & time: 10/11/22  0741     History  Chief Complaint  Patient presents with   Ankle Pain    Christine [name] is a 22 y.o. female.  22 year old female here today for left ankle pain.  She denies any trauma to the area.  Says that she is on her feet a lot for work.  She has had a difficult time bearing weight since.   Ankle Pain      Home Medications Prior to Admission medications   Medication Sig Start Date End Date Taking? Authorizing Provider  ACCU-CHEK AVIVA PLUS test strip USE TO TEST SIX TIMES DAILY 01/31/21   [name], MD  Clindamycin-Benzoyl Per, Refr, gel Apply twice daily as needed Patient not taking: Reported on 04/11/2021 02/02/21   Billey Co, MD  Continuous Blood Gluc Transmit (DEXCOM G6 TRANSMITTER) MISC USE AS DIRECTED TO TEST BLOOD SUGAR 02/07/21   [name], MD  Continuous Glucose Receiver (DEXCOM G7 RECEIVER) DEVI 1 Units by Does not apply route daily. 09/14/22   Tiffany Kocher, DO  Continuous Glucose Sensor (DEXCOM G7 SENSOR) MISC Place one sensor on skin. Replace every 10 days. 09/15/22   Tiffany Kocher, DO  doxycycline (VIBRA-TABS) 100 MG tablet Take 1 tablet (100 mg total) by mouth 2 (two) times daily. After 2 weeks, decrease the dose to once daily. 05/20/21   Lilland, Alana, DO  Glucagon (BAQSIMI TWO PACK) 3 MG/DOSE POWD Place 1 spray into the nose as directed. 02/02/20   [name], MD  insulin glargine (LANTUS) 100 UNIT/ML Solostar Pen Inject 16 Units into the skin in the morning. 10/10/22   Tiffany Kocher, DO  Insulin Pen Needle (BD PEN NEEDLE NANO 2ND GEN) 32G X 4 MM MISC Please use to administer insulin. 09/27/22   Tiffany Kocher, DO  VICTOZA 18 MG/3ML SOPN ADMINISTER 1.8 MG UNDER THE SKIN DAILY. START AT 0.6 MG DAILY AND. INCREASE AS DIRECTED TO. MAX TOLERATED DOSE Patient taking differently: Inject 1 mg into the skin daily. 11/15/20    [name], MD      Allergies    Patient has no known allergies.    Review of Systems   Review of Systems  Physical Exam Updated Vital Signs BP (!) 125/91 (BP Location: Right Arm)   Pulse (!) 115   Temp 98.3 F (36.8 C) (Oral)   Resp 18   LMP 06/12/2022   SpO2 98%  Physical Exam Musculoskeletal:     Comments: Ankle-left ankle, mild swelling of the lateral malleolus.  No crepitus.  Pain with plantarflexion.  No fifth metatarsal tenderness.     ED Results / Procedures / Treatments   Labs (all labs ordered are listed, but only abnormal results are displayed) Labs Reviewed - No data to display  EKG None  Radiology DG Ankle Complete Left  Result Date: 10/11/2022 CLINICAL DATA:  pain, swelling EXAM: LEFT ANKLE COMPLETE - 3+ VIEW COMPARISON:  01/23/2015. FINDINGS: No acute fracture or dislocation. No aggressive osseous lesion. No significant arthritis of imaged joints. Ankle mortise appears intact. No focal soft tissue swelling. No radiopaque foreign bodies. IMPRESSION: Negative. Electronically Signed   By: Jules Schick M.D.   On: 10/11/2022 08:11    Procedures Procedures    Medications Ordered in ED Medications  acetaminophen (TYLENOL) tablet 1,000 mg (1,000 mg Oral Given 10/11/22 0817)  ibuprofen (ADVIL) tablet 600 mg (600 mg Oral Given 10/11/22 0817)  ED Course/ Medical Decision Making/ A&P                                 Medical Decision Making 72 female here today for left ankle pain.  Differential diagnoses include ankle sprain, less likely ankle fracture.  Plan-patient's exam most consistent with ankle sprain.  Plain film does not show any fracture.  Will provide crutches, Aircast.  Counseled patient on rest, ice, compress and elevate.  Amount and/or Complexity of Data Reviewed Radiology: ordered.  Risk OTC drugs.          Final Clinical Impression(s) / ED Diagnoses Final diagnoses:  None    Rx / DC Orders ED Discharge Orders     None          Anders Simmonds T, DO 10/11/22 [phone]

## 2022-10-11 NOTE — Discharge Instructions (Signed)
Use crutches, and Aircast the neck several days.  Apply ice to the area.  You can take 1000 mg of Tylenol every 8 hours, 400 mg ibuprofen every 6 hours.  Your ankle should begin to feel better over the next 1 to 2 weeks.

## 2022-10-12 ENCOUNTER — Other Ambulatory Visit (HOSPITAL_COMMUNITY): Payer: Self-pay

## 2022-10-17 DIAGNOSIS — K8020 Calculus of gallbladder without cholecystitis without obstruction: Secondary | ICD-10-CM | POA: Diagnosis not present

## 2022-11-10 ENCOUNTER — Ambulatory Visit: Payer: Self-pay | Admitting: Student

## 2022-11-13 ENCOUNTER — Ambulatory Visit (INDEPENDENT_AMBULATORY_CARE_PROVIDER_SITE_OTHER): Payer: Medicaid Other | Admitting: Student

## 2022-11-13 ENCOUNTER — Encounter: Payer: Self-pay | Admitting: Student

## 2022-11-13 VITALS — BP 127/89 | HR 78 | Ht 66.0 in | Wt 188.0 lb

## 2022-11-13 DIAGNOSIS — E11628 Type 2 diabetes mellitus with other skin complications: Secondary | ICD-10-CM | POA: Diagnosis not present

## 2022-11-13 NOTE — Patient Instructions (Signed)
I am increasing your Lantus to 20 units daily in the morning.   Monitor your sugars. If you notice them dropping below 100 please call and let us know. Otherwise follow up with Dr. Claudean Severance as below:   Future Appointments  Date Time Provider Department Center  11/24/2022  9:20 AM Tiffany Kocher, DO [facility]    Please arrive 15 minutes before your appointment to ensure smooth check in process.    Please call the clinic at [phone] if your symptoms worsen or you have any concerns.  Thank you for allowing me to participate in your care, Dr. Glendale Chard [facility]

## 2022-11-13 NOTE — Progress Notes (Signed)
    SUBJECTIVE:   CHIEF COMPLAINT / HPI:   Christine [name] is a 22 y.o. female  presenting for worsening appetite, early satiety, and uncontrolled T2DM.   T2DM:  Lantus 16 units daily in the AM  Last A1c 11.2 2 months ago  Has Dexcom and reports sugars range from 300-400s, sometimes fasting they will be in the 200s Was not able to start GLP1 and has metformin intolerance   Decreased appetite present for several months. Recently went to [facility] to be evaluated for lap chole. Appears surgery is set in October. Symptoms appear to be intermittent and not associated with pain.    PERTINENT  PMH / PSH: Reviewed and updated   OBJECTIVE:   BP 127/89   Pulse 78   Ht 5\' 6"  (1.676 m)   Wt 188 lb (85.3 kg)   LMP 11/03/2022   SpO2 100%   BMI 30.34 kg/m   well-appearing, no acute distress Cardio: Regular rate, regular rhythm, no murmurs on exam. Pulm: Clear, no wheezing, no crackles. No increased work of breathing Abdominal: bowel sounds present, soft, non-tender, non-distended Extremities: no peripheral edema  Neuro: alert and oriented x3, speech normal in content, no facial asymmetry, strength intact and equal bilaterally in UE and LE, pupils equal and reactive to light.  Psych:  Cognition and judgment appear intact. Alert, communicative  and cooperative with normal attention span and concentration. No apparent delusions, illusions, hallucinations      11/13/2022    3:56 PM 10/10/2022   10:31 AM 09/14/2022   10:48 AM  PHQ9 SCORE ONLY  PHQ-9 Total Score 4 1 2       ASSESSMENT/PLAN:   Diabetes mellitus (HCC) Increase Lantus to 20 units daily. Scheduled for f/u with PCP for A1c recheck. Discussed safety precautions with increasing insulin. She will call the clinic if sugars drop <100    Early Satiety:  Most likely related to gastroparesis 2/2 to uncontrolled diabetes. Could also have component of choledocholithiasis. Discussed getting better control of blood sugar and reevaluation after  cholecystectomy.   Glendale Chard, DO [facility]

## 2022-11-13 NOTE — Assessment & Plan Note (Signed)
Increase Lantus to 20 units daily. Scheduled for f/u with PCP for A1c recheck. Discussed safety precautions with increasing insulin. She will call the clinic if sugars drop <100

## 2022-11-15 ENCOUNTER — Encounter: Payer: Self-pay | Admitting: Student

## 2022-11-15 NOTE — Addendum Note (Signed)
Addended by: Caro Laroche on: 11/15/2022 10:59 AM   Modules accepted: Orders

## 2022-11-24 ENCOUNTER — Ambulatory Visit: Payer: Medicaid Other | Admitting: Student

## 2022-12-05 ENCOUNTER — Ambulatory Visit: Payer: Medicaid Other | Admitting: Student

## 2022-12-06 DIAGNOSIS — R1011 Right upper quadrant pain: Secondary | ICD-10-CM | POA: Diagnosis not present

## 2022-12-06 DIAGNOSIS — K8010 Calculus of gallbladder with chronic cholecystitis without obstruction: Secondary | ICD-10-CM | POA: Diagnosis not present

## 2022-12-15 ENCOUNTER — Ambulatory Visit: Payer: Medicaid Other | Admitting: Student

## 2022-12-21 ENCOUNTER — Ambulatory Visit: Payer: Medicaid Other | Admitting: Student

## 2022-12-26 ENCOUNTER — Ambulatory Visit: Payer: Medicaid Other | Admitting: Student

## 2022-12-26 ENCOUNTER — Encounter: Payer: Self-pay | Admitting: Student

## 2022-12-26 VITALS — BP 130/85 | HR 88 | Ht 66.0 in | Wt 199.6 lb

## 2022-12-26 DIAGNOSIS — Z7985 Long-term (current) use of injectable non-insulin antidiabetic drugs: Secondary | ICD-10-CM | POA: Diagnosis not present

## 2022-12-26 DIAGNOSIS — E11628 Type 2 diabetes mellitus with other skin complications: Secondary | ICD-10-CM

## 2022-12-26 DIAGNOSIS — E11638 Type 2 diabetes mellitus with other oral complications: Secondary | ICD-10-CM

## 2022-12-26 LAB — POCT GLYCOSYLATED HEMOGLOBIN (HGB A1C): HbA1c, POC (controlled diabetic range): 9 % — AB (ref 0.0–7.0)

## 2022-12-26 MED ORDER — SEMAGLUTIDE(0.25 OR 0.5MG/DOS) 2 MG/1.5ML ~~LOC~~ SOPN: PEN_INJECTOR | SUBCUTANEOUS | 0 refills | Status: DC

## 2022-12-26 NOTE — Progress Notes (Signed)
    SUBJECTIVE:   CHIEF COMPLAINT / HPI:   Type 2 diabetes Taking 16 U of Lantus daily. A1C of 9.0 today, down from 11.9. Has Dexcom, readings consistently above 200. S/p cholecystectomy for gall-stones. Previously on Victoza years ago, and was well controlled. Would like to restart GLP-1 and interested in weekly injections. Needs eye exam.  OBJECTIVE:   BP 130/85   Pulse 88   Ht 5\' 6"  (1.676 m)   Wt 199 lb 9.6 oz (90.5 kg)   LMP 12/04/2022   SpO2 100%   BMI 32.22 kg/m    General: NAD, pleasant Cardio: RRR, no MRG.  Respiratory: CTAB, normal wob on RA GI: Abdomen is soft, not tender, not distended. BS present Skin: Warm and dry   ASSESSMENT/PLAN:   Assessment & Plan Type 2 diabetes mellitus with other skin complication, without long-term current use of insulin (HCC) Improved A1c to 9.0 on Lantus. S/p cholecystectomy. Plan to start GLP-1. Counseled on risks and benefits. -Ozempic 0.25 mg for 4 weeks, then 0.5 mg for 4 weeks -Continue Lantus 16 U daily -Counseled on Hypoglycemia and action plan provided in AVS -Resource for Eye exam provided in AVS -Follow-up in 4 weeks   Tiffany Kocher, DO [facility]

## 2022-12-26 NOTE — Patient Instructions (Addendum)
It was great to see you! Thank you for allowing me to participate in your care!   I recommend that you always bring your medications to each appointment as this makes it easy to ensure we are on the correct medications and helps [place] not miss when refills are needed.  Our plans for today:  - Take Ozempic 0.25 mg weekly for 4 weeks.  Then increase to 0.5 mg weekly for 4 weeks. -Follow-up in 4 weeks -If your blood sugar drops below 100 and you are having symptoms, please notify PCP immediately.  I would recommend drinking juice or eating a hard candy.  Recheck your blood sugar after 15 minutes.  If it is still not improved, repeat juice or hard candy.  If it remains low, seek medical care. -If your blood sugar starts to improve, we may start to decrease her insulin -Please get diabetic eye exam, which includes dilation of eyes.  I recommend [facility] [phone]  Take care and seek immediate care sooner if you develop any concerns. Please remember to show up 15 minutes before your scheduled appointment time!  Tiffany Kocher, DO [facility]

## 2022-12-26 NOTE — Assessment & Plan Note (Signed)
Improved A1c to 9.0 on Lantus. S/p cholecystectomy. Plan to start GLP-1. Counseled on risks and benefits. -Ozempic 0.25 mg for 4 weeks, then 0.5 mg for 4 weeks -Continue Lantus 16 U daily -Counseled on Hypoglycemia and action plan provided in AVS -Resource for Eye exam provided in AVS -Follow-up in 4 weeks

## 2022-12-28 ENCOUNTER — Telehealth: Payer: Self-pay

## 2022-12-28 DIAGNOSIS — E11628 Type 2 diabetes mellitus with other skin complications: Secondary | ICD-10-CM

## 2022-12-28 NOTE — Telephone Encounter (Signed)
Mother calls nurse line requesting that prescription be transferred from [facility] to [facility].   Called and canceled prescription at [facility]   Received error message on prescription, requesting verification of directions.  Will forward to PCP to resend.   Veronda Prude, RN

## 2022-12-29 ENCOUNTER — Other Ambulatory Visit: Payer: Self-pay | Admitting: Student

## 2022-12-29 DIAGNOSIS — E11628 Type 2 diabetes mellitus with other skin complications: Secondary | ICD-10-CM

## 2022-12-29 MED ORDER — SEMAGLUTIDE(0.25 OR 0.5MG/DOS) 2 MG/1.5ML ~~LOC~~ SOPN
PEN_INJECTOR | SUBCUTANEOUS | 0 refills | Status: DC
Start: 2022-12-29 — End: 2023-02-13

## 2022-12-29 NOTE — Progress Notes (Signed)
Ozempic sent to [facility] per patient request.

## 2022-12-29 NOTE — Telephone Encounter (Signed)
Ozempic sent to [facility]

## 2023-01-01 ENCOUNTER — Telehealth: Payer: Self-pay

## 2023-01-01 NOTE — Telephone Encounter (Signed)
Mother calls nurse line regarding PA being required on Ozempic.   Clinical questions submitted via Covermymeds.   Key: [id number]  PA approved through 01/01/2024.  Called and provided update for mother.   Veronda Prude, RN

## 2023-01-25 ENCOUNTER — Ambulatory Visit: Payer: Self-pay | Admitting: Student

## 2023-01-27 ENCOUNTER — Encounter: Payer: Self-pay | Admitting: Student

## 2023-01-29 ENCOUNTER — Other Ambulatory Visit: Payer: Self-pay

## 2023-01-29 MED ORDER — DEXCOM G7 SENSOR MISC: 2 refills | Status: AC

## 2023-02-13 ENCOUNTER — Ambulatory Visit: Payer: Medicaid Other | Admitting: Student

## 2023-02-13 ENCOUNTER — Encounter: Payer: Self-pay | Admitting: Student

## 2023-02-13 VITALS — BP 134/80 | HR 85 | Ht 66.0 in | Wt 199.5 lb

## 2023-02-13 DIAGNOSIS — E11628 Type 2 diabetes mellitus with other skin complications: Secondary | ICD-10-CM

## 2023-02-13 DIAGNOSIS — L732 Hidradenitis suppurativa: Secondary | ICD-10-CM

## 2023-02-13 MED ORDER — DOXYCYCLINE HYCLATE 100 MG PO TABS
ORAL_TABLET | ORAL | 0 refills | Status: DC
Start: 2023-02-13 — End: 2023-03-22

## 2023-02-13 MED ORDER — LIRAGLUTIDE 18 MG/3ML ~~LOC~~ SOPN
PEN_INJECTOR | SUBCUTANEOUS | 0 refills | Status: DC
Start: 2023-02-13 — End: 2023-03-19

## 2023-02-13 MED ORDER — INSULIN GLARGINE 100 UNIT/ML SOLOSTAR PEN
16.0000 [IU] | PEN_INJECTOR | Freq: Every morning | SUBCUTANEOUS | 1 refills | Status: DC
Start: 2023-02-13 — End: 2023-11-14

## 2023-02-13 NOTE — Assessment & Plan Note (Signed)
Hidradenitis suppurativa flare, left armpit.  Not responsive to topical antibiotics. - Oral doxycycline, 100 mg twice daily for 14 days.  Followed by 100 mg daily for 14 days.  Follow-up if not improved. - Use antiseptic soap

## 2023-02-13 NOTE — Progress Notes (Cosign Needed Addendum)
    SUBJECTIVE:   CHIEF COMPLAINT / HPI:   Christine [name] is a 22 year old with PMHx of DM presenting for DM management.   Type 2 diabetes Patient with poorly controlled type 2 diabetes.  Currently wearing her CGM monitor, blood glucose consistently over 200.  No hypoglycemic events, no blood sugars greater than 250.  Currently taking 16 units of Lantus daily.  Attempted to start Ozempic over Victoza, to prevent needle burden.  Prior authorization for Ozempic was completed, and patient started 1 week of Ozempic.  Unfortunately, GI symptoms and reduced appetite are negative side effects for her-she wishes to switch back to Victoza which had less side effects for her and controlled her diabetes well.  On chart review, she did have well-controlled type 2 diabetes with Victoza and eventually came off of insulin with this medication.  Hidradenitis suppurativa Patient reports that she has a flare of her hidradenitis suppurativa under her arm.  Currently using clindamycin gel, but states this is not working.  She would like to try an oral medication today.  OBJECTIVE:   BP 134/80   Pulse 85   Ht 5\' 6"  (1.676 m)   Wt 199 lb 8 oz (90.5 kg)   LMP 02/03/2023   BMI 32.20 kg/m   Physical Exam Constitutional:      Appearance: Normal appearance.  Cardiovascular:     Rate and Rhythm: Normal rate and regular rhythm.  Pulmonary:     Effort: Pulmonary effort is normal.     Breath sounds: Normal breath sounds.  Abdominal:     General: Bowel sounds are normal. There is no distension.     Palpations: Abdomen is soft.     Tenderness: There is no abdominal tenderness.  Neurological:     Mental Status: She is alert.      ASSESSMENT/PLAN:   Assessment & Plan Type 2 diabetes mellitus with other skin complication, without long-term current use of insulin (HCC) Poorly controlled type 2 diabetes, last A1c of 9.  Too soon to recheck A1c, and doubt A1c is improved with only 1 week of Ozempic.  Patient  reports she cannot tolerate the side effects of Ozempic.  Previously well-controlled with Victoza, and would like to restart medication.  Compliant with 16 units of Lantus. - Victoza titration, 0.6 mg daily for 7 days followed by 1.2 mg daily for 28 days - Continue 16 use of Lantus, with anticipation insulin requirement may decrease with Victoza - Counseled on hypoglycemic events - Follow-up in 4 weeks Hydradenitis Hidradenitis suppurativa flare, left armpit.  Not responsive to topical antibiotics. - Oral doxycycline, 100 mg twice daily for 14 days.  Followed by 100 mg daily for 14 days.  Follow-up if not improved. - Use antiseptic soap  Oscar La, Medical Student [employer] [facility]   I was personally present and performed or re-performed the history, physical exam and medical decision making activities of this service and have verified that the service and findings are accurately documented in the student's note.  Tiffany Kocher, DO                  02/13/2023, 3:19 PM

## 2023-02-13 NOTE — Assessment & Plan Note (Addendum)
Poorly controlled type 2 diabetes, last A1c of 9.  Too soon to recheck A1c, and doubt A1c is improved with only 1 week of Ozempic.  Patient reports she cannot tolerate the side effects of Ozempic.  Previously well-controlled with Victoza, and would like to restart medication.  Compliant with 16 units of Lantus. - Victoza titration, 0.6 mg daily for 7 days followed by 1.2 mg daily for 28 days - Continue 16 use of Lantus, with anticipation insulin requirement may decrease with Victoza - Counseled on hypoglycemic events - Follow-up in 4 weeks

## 2023-02-13 NOTE — Patient Instructions (Signed)
It was great to see you! Thank you for allowing me to participate in your care!   I recommend that you always bring your medications to each appointment as this makes it easy to ensure we are on the correct medications and helps [place] not miss when refills are needed.  Our plans for today:  -Stop taking Ozempic. -Take Victoza 0.6 mg daily for 7 days, then increase to 1.2 mg daily. -Please follow-up in 4 weeks, and we may need to increase Victoza at that time -Continue taking 16 units of Lantus. -If your blood sugar drops below 100, please notify our office and we will need to decrease insulin.   Take care and seek immediate care sooner if you develop any concerns. Please remember to show up 15 minutes before your scheduled appointment time!  Tiffany Kocher, DO [facility]

## 2023-02-15 ENCOUNTER — Other Ambulatory Visit (HOSPITAL_COMMUNITY): Payer: Self-pay

## 2023-02-15 ENCOUNTER — Telehealth: Payer: Self-pay

## 2023-02-15 NOTE — Telephone Encounter (Signed)
Pharmacy Patient Advocate Encounter  Received notification from [facility] that Prior Authorization for [facility] G7 SENSORS has been APPROVED from 02/15/23 to 02/15/24

## 2023-02-15 NOTE — Telephone Encounter (Signed)
Pharmacy Patient Advocate Encounter   Received notification from CoverMy[employer]eds that prior authorization for DECOM G7 SENSORS is required/requested.   REAUTHORIZATION REQUIRED  PA required; PA submitted to above mentioned insurance via CoverMyMeds Key/confirmation #/EOC [id number]. Status is pending

## 2023-02-20 ENCOUNTER — Other Ambulatory Visit (HOSPITAL_COMMUNITY): Payer: Self-pay

## 2023-02-22 ENCOUNTER — Other Ambulatory Visit (HOSPITAL_COMMUNITY): Payer: Self-pay

## 2023-02-22 ENCOUNTER — Telehealth: Payer: Self-pay

## 2023-02-22 NOTE — Telephone Encounter (Signed)
Pharmacy Patient Advocate Encounter  Received notification from [facility] that Prior Authorization for LIRAGLUTIDE 2-PAK [facility]) has been APPROVED from 02/19/23 to 02/1924   PA #/Case ID/Reference #: [id number]

## 2023-02-22 NOTE — Telephone Encounter (Signed)
Pharmacy Patient Advocate Encounter   Received notification from CoverMyMeds that prior authorization for VICTOZA is required/requested.  The patient is insured through [facility] .   PA required; PA submitted to above mentioned insurance via CoverMyMeds Key/confirmation #/EOC [id number]. Status is pending

## 2023-03-19 ENCOUNTER — Other Ambulatory Visit: Payer: Self-pay | Admitting: Student

## 2023-03-19 DIAGNOSIS — E11628 Type 2 diabetes mellitus with other skin complications: Secondary | ICD-10-CM

## 2023-03-21 ENCOUNTER — Encounter (INDEPENDENT_AMBULATORY_CARE_PROVIDER_SITE_OTHER): Payer: Self-pay

## 2023-03-22 ENCOUNTER — Other Ambulatory Visit: Payer: Self-pay | Admitting: Student

## 2023-03-22 DIAGNOSIS — L732 Hidradenitis suppurativa: Secondary | ICD-10-CM

## 2023-04-02 ENCOUNTER — Ambulatory Visit: Payer: Medicaid Other | Admitting: Student

## 2023-04-11 ENCOUNTER — Ambulatory Visit: Payer: Medicaid Other | Admitting: Student

## 2023-04-23 ENCOUNTER — Encounter: Payer: Self-pay | Admitting: Family Medicine

## 2023-04-23 NOTE — Progress Notes (Signed)
PCP recommended dismissal from the practice due to multiple missed appointments. The dismissal letter was printed and handed over to Vea to process as a certified letter. I verified the names and addresses on the letters and envelopes for each patient and advised Vea to do the same before mailing the letters.

## 2023-04-23 NOTE — Progress Notes (Signed)
PCP requested dismissal for multiple no-shows to appointments.

## 2023-05-01 ENCOUNTER — Encounter (INDEPENDENT_AMBULATORY_CARE_PROVIDER_SITE_OTHER): Payer: Medicaid Other | Admitting: Primary Care

## 2023-06-10 ENCOUNTER — Other Ambulatory Visit: Payer: Self-pay | Admitting: Student

## 2023-06-10 ENCOUNTER — Other Ambulatory Visit: Payer: Self-pay | Admitting: Family Medicine

## 2023-06-10 DIAGNOSIS — E11628 Type 2 diabetes mellitus with other skin complications: Secondary | ICD-10-CM

## 2023-06-30 ENCOUNTER — Ambulatory Visit (HOSPITAL_COMMUNITY): Admission: EM | Admit: 2023-06-30 | Discharge: 2023-06-30 | Disposition: A | Discharge status: Home / Self Care

## 2023-06-30 ENCOUNTER — Encounter (HOSPITAL_COMMUNITY): Payer: Self-pay

## 2023-06-30 DIAGNOSIS — L02412 Cutaneous abscess of left axilla: Secondary | ICD-10-CM | POA: Diagnosis not present

## 2023-06-30 MED ORDER — DOXYCYCLINE HYCLATE 100 MG PO CAPS
100.0000 mg | ORAL_CAPSULE | Freq: Two times a day (BID) | ORAL | 0 refills | Status: AC
Start: 2023-06-30 — End: ?

## 2023-06-30 NOTE — ED Triage Notes (Signed)
 Patient reports abscess under left arm x 1 day. Denies any drainage at this time.

## 2023-06-30 NOTE — ED Provider Notes (Signed)
 [facility]  Note:  This document was prepared using Dragon voice recognition software and may include unintentional dictation errors.  MRN: [id number] DOB: [birth year]  Subjective:   Christine [name] is a 23 y.o. female presenting for left axillary abscess x 1 day.  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  Patient states that she was given referral last time for general surgery to have cyst removed however she never followed up.  Patient would like repeat referral and states that she will follow-up this time.  Patient states that abscess is not draining she has been keeping warm compresses under her arm but has not had any success in getting abscess to drain.  Patient reports moderate pain with palpation of the area but no surrounding erythema, swelling or pain.  No current facility-administered medications for this encounter.  Current Outpatient Medications:    doxycycline  (VIBRAMYCIN ) 100 MG capsule, Take 1 capsule (100 mg total) by mouth 2 (two) times daily., Disp: 20 capsule, Rfl: 0   ACCU-CHEK AVIVA PLUS test strip, USE TO TEST SIX TIMES DAILY (Patient not taking: Reported on 02/13/2023), Disp: 600 strip, Rfl: 1   Clindamycin -Benzoyl Per, Refr, gel, Apply twice daily as needed (Patient not taking: Reported on 04/11/2021), Disp: 45 g, Rfl: 0   Continuous Blood Gluc Transmit (DEXCOM G6 TRANSMITTER) MISC, USE AS DIRECTED TO TEST BLOOD SUGAR, Disp: 1 each, Rfl: 3   Continuous Glucose Receiver (DEXCOM G7 RECEIVER) DEVI, 1 Units by Does not apply route daily., Disp: 1 each, Rfl: 0   Continuous Glucose Sensor (DEXCOM G7 SENSOR) MISC, Place one sensor on skin. Replace every 10 days., Disp: 3 each, Rfl: 2   Glucagon  (BAQSIMI  TWO PACK) 3 MG/DOSE POWD, Place 1 spray into the nose as directed., Disp: 2 each, Rfl: 3   insulin  glargine (LANTUS ) 100 UNIT/ML Solostar Pen, Inject 16 Units into the skin in the morning., Disp: 3 mL, Rfl: 1   Insulin  Pen Needle (BD PEN NEEDLE NANO 2ND  GEN) 32G X 4 MM MISC, Please use to administer insulin ., Disp: 200 each, Rfl: 5   liraglutide  (VICTOZA ) 18 MG/3ML SOPN, Inject 1.2 mg into the skin daily., Disp: 3 mL, Rfl: 2   Allergies  Allergen Reactions   Metformin  And Related     Past Medical History:  Diagnosis Date   Abscess    Asthma    Diabetes mellitus without complication (HCC)      Past Surgical History:  Procedure Laterality Date   ARM WOUND REPAIR / CLOSURE Right 12/2019   Right upper arm     Family History  Problem Relation Age of Onset   Hypertension Mother    Diabetes Maternal Grandmother    Hypertension Maternal Grandmother    Diabetes Maternal Grandfather    Hypertension Maternal Grandfather     Social History   Tobacco Use   Smoking status: Never   Smokeless tobacco: Never  Substance Use Topics   Alcohol use: No    ROS Refer to HPI for ROS details.  Objective:   Vitals: BP (!) 147/83 (BP Location: Left Arm)   Pulse (!) 113   Temp 98.4 F (36.9 C) (Oral)   Resp 17   LMP  (LMP Unknown)   SpO2 99%   Physical Exam Vitals and nursing note reviewed.  Constitutional:      General: She is not in acute distress.    Appearance: Normal appearance. She is not ill-appearing.  HENT:     Head: Normocephalic.  Cardiovascular:  Rate and Rhythm: Normal rate.  Pulmonary:     Effort: Pulmonary effort is normal. No respiratory distress.  Skin:    General: Skin is warm and dry.     Capillary Refill: Capillary refill takes less than 2 seconds.     Findings: Abscess (Approximately 2.5 cm left axillary abscess, no discharge, moderate erythema, mild swelling, no surrounding erythema or swelling, no induration.) and erythema present.  Neurological:     General: No focal deficit present.     Mental Status: She is alert and oriented to person, place, and time.  Psychiatric:        Mood and Affect: Mood normal.        Behavior: Behavior normal.     Procedures  No results found for this or any  previous visit (from the past 24 hours).  No results found.   Assessment and Plan :     Discharge Instructions       1. Abscess of left axilla (Primary) - doxycycline  (VIBRAMYCIN ) 100 MG capsule; Take 1 capsule (100 mg total) by mouth 2 (two) times daily.  Dispense: 20 capsule; Refill: 0 - Ambulatory referral to General Surgery for removal of axillary cyst causing recurrent abscess. -Continue to monitor symptoms for any change in severity if there is any escalation of current symptoms or development of new symptoms follow-up in ER for further evaluation and management.      Admiral Marcucci B [facility]   Da Aut[place]ment, [place], [place] 06/30/23 Jerre Moots

## 2023-06-30 NOTE — Discharge Instructions (Addendum)
  1. Abscess of left axilla (Primary) - doxycycline  (VIBRAMYCIN ) 100 MG capsule; Take 1 capsule (100 mg total) by mouth 2 (two) times daily.  Dispense: 20 capsule; Refill: 0 - Ambulatory referral to General Surgery for removal of axillary cyst causing recurrent abscess. -Continue to monitor symptoms for any change in severity if there is any escalation of current symptoms or development of new symptoms follow-up in ER for further evaluation and management.

## 2023-07-01 ENCOUNTER — Other Ambulatory Visit: Payer: Self-pay | Admitting: Student

## 2023-07-01 DIAGNOSIS — E11628 Type 2 diabetes mellitus with other skin complications: Secondary | ICD-10-CM

## 2023-07-02 ENCOUNTER — Emergency Department (HOSPITAL_COMMUNITY)
Admission: EM | Admit: 2023-07-02 | Discharge: 2023-07-02 | Disposition: A | Discharge status: Home / Self Care | Attending: Emergency Medicine | Admitting: Emergency Medicine

## 2023-07-02 ENCOUNTER — Other Ambulatory Visit: Payer: Self-pay

## 2023-07-02 ENCOUNTER — Encounter (HOSPITAL_COMMUNITY): Payer: Self-pay

## 2023-07-02 DIAGNOSIS — L02412 Cutaneous abscess of left axilla: Secondary | ICD-10-CM | POA: Diagnosis not present

## 2023-07-02 DIAGNOSIS — L0291 Cutaneous abscess, unspecified: Secondary | ICD-10-CM

## 2023-07-02 DIAGNOSIS — L02414 Cutaneous abscess of left upper limb: Secondary | ICD-10-CM | POA: Diagnosis not present

## 2023-07-02 MED ORDER — LIDOCAINE-EPINEPHRINE (PF) 2 %-1:200000 IJ SOLN
10.0000 mL | Freq: Once | INTRAMUSCULAR | Status: DC
  Filled 2023-07-02: qty 20

## 2023-07-02 NOTE — ED Notes (Signed)
I&D tray at pt bedside  

## 2023-07-02 NOTE — ED Provider Notes (Signed)
 [place] EMERGENCY DEPARTMENT AT [facility] Provider Note   CSN: [id number] Arrival date & time: 07/02/23  1914     History  Chief Complaint  Patient presents with   Recurrent Skin Infections    Christine [name] is a 23 y.o. female.  23 yo F with a cc of pain and swelling to the left axilla.  Going on for a few days.  She has a history of recurrent abscesses.  She was seen in urgent care a couple days ago was started on antibiotics.  She feels like it has not got any better.  Having some subjective fevers at home.        Home Medications Prior to Admission medications   Medication Sig Start Date End Date Taking? Authorizing Provider  ACCU-CHEK AVIVA PLUS test strip USE TO TEST SIX TIMES DAILY Patient not taking: Reported on 02/13/2023 01/31/21   Ovidio Blower, MD  Clindamycin -Benzoyl Per, Refr, gel Apply twice daily as needed Patient not taking: Reported on 04/11/2021 02/02/21   Charmel Cooter, MD  Continuous Blood Gluc Transmit (DEXCOM G6 TRANSMITTER) MISC USE AS DIRECTED TO TEST BLOOD SUGAR 02/07/21   Ovidio Blower, MD  Continuous Glucose Receiver (DEXCOM G7 RECEIVER) DEVI 1 Units by Does not apply route daily. 09/14/22   Lavada Porteous, DO  Continuous Glucose Sensor (DEXCOM G7 SENSOR) MISC Place one sensor on skin. Replace every 10 days. 01/29/23   Jonne Netters, MD  doxycycline  (VIBRAMYCIN ) 100 MG capsule Take 1 capsule (100 mg total) by mouth 2 (two) times daily. 06/30/23   Reddick, Johnathan B, NP  Glucagon  (BAQSIMI  TWO PACK) 3 MG/DOSE POWD Place 1 spray into the nose as directed. 02/02/20   Ovidio Blower, MD  insulin  glargine (LANTUS ) 100 UNIT/ML Solostar Pen Inject 16 Units into the skin in the morning. 02/13/23   Lavada Porteous, DO  Insulin  Pen Needle (BD PEN NEEDLE NANO 2ND GEN) 32G X 4 MM MISC Please use to administer insulin . 09/27/22   Lavada Porteous, DO  liraglutide  (VICTOZA ) 18 MG/3ML SOPN Inject 1.2 mg into the skin daily. 03/19/23 05/03/23   Lavada Porteous, DO      Allergies    Metformin  and related    Review of Systems   Review of Systems  Physical Exam Updated Vital Signs BP (!) 150/100   Pulse 95   Temp 98.6 F (37 C)   Resp 17   Ht 5\' 6"  (1.676 m)   Wt 90.3 kg   LMP  (LMP Unknown)   SpO2 100%   BMI 32.12 kg/m  Physical Exam Vitals and nursing note reviewed.  Constitutional:      General: She is not in acute distress.    Appearance: She is well-developed. She is not diaphoretic.  HENT:     Head: Normocephalic and atraumatic.  Eyes:     Pupils: Pupils are equal, round, and reactive to light.  Cardiovascular:     Rate and Rhythm: Normal rate and regular rhythm.     Heart sounds: No murmur heard.    No friction rub. No gallop.  Pulmonary:     Effort: Pulmonary effort is normal.     Breath sounds: No wheezing or rales.  Abdominal:     General: There is no distension.     Palpations: Abdomen is soft.     Tenderness: There is no abdominal tenderness.  Musculoskeletal:        General: No tenderness.     Cervical back: Normal range of motion and  neck supple.     Comments: Left axilla with multiple areas of scarring.  Does have an area of erythema and fluctuance.  Skin:    General: Skin is warm and dry.  Neurological:     Mental Status: She is alert and oriented to person, place, and time.  Psychiatric:        Behavior: Behavior normal.     ED Results / Procedures / Treatments   Labs (all labs ordered are listed, but only abnormal results are displayed) Labs Reviewed - No data to display  EKG None  Radiology No results found.  Procedures .Incision and Drainage  Date/Time: 07/02/2023 8:47 AM  Performed by: Albertus Hughs, DO Authorized by: Albertus Hughs, DO   Consent:    Consent obtained:  Verbal   Consent given by:  Patient   Risks, benefits, and alternatives were discussed: yes     Risks discussed:  Bleeding, incomplete drainage and infection   Alternatives discussed:  No treatment,  delayed treatment and alternative treatment Universal protocol:    Procedure explained and questions answered to patient or proxy's satisfaction: yes     Patient identity confirmed:  Verbally with patient Location:    Type:  Abscess Pre-procedure details:    Skin preparation:  Chlorhexidine Sedation:    Sedation type:  None Anesthesia:    Anesthesia method:  Local infiltration   Local anesthetic:  Lidocaine  2% WITH epi Procedure type:    Complexity:  Complex Procedure details:    Ultrasound guidance: no     Needle aspiration: no     Incision types:  Single straight   Incision depth:  Subcutaneous   Wound management:  Probed and deloculated   Drainage:  Bloody and purulent   Drainage amount:  Copious   Wound treatment:  Wound left open   Packing materials:  None Post-procedure details:    Procedure completion:  Tolerated well, no immediate complications     Medications Ordered in ED Medications  lidocaine -EPINEPHrine  (XYLOCAINE  W/EPI) 2 %-1:200000 (PF) injection 10 mL (has no administration in time range)    ED Course/ Medical Decision Making/ A&P                                 Medical Decision Making Risk Prescription drug management.   23 yo F with a chief complaints of an abscess to the left axilla.  Unfortunately this is a recurrent issue for her.  On my record review, she was seen in urgent care couple days ago and was started on doxycycline .  Patient reportedly has had no improvement.  Will perform I&D at bedside.  I&D without issue.  D/c home.  PCP follow up.  8:47 AM:  I have discussed the diagnosis/risks/treatment options with the patient.  Evaluation and diagnostic testing in the emergency department does not suggest an emergent condition requiring admission or immediate intervention beyond what has been performed at this time.  They will follow up with PCP. We also discussed returning to the ED immediately if new or worsening sx occur. We discussed the sx  which are most concerning (e.g., sudden worsening pain, fever, inability to tolerate by mouth) that necessitate immediate return. Medications administered to the patient during their visit and any new prescriptions provided to the patient are listed below.  Medications given during this visit Medications  lidocaine -EPINEPHrine  (XYLOCAINE  W/EPI) 2 %-1:200000 (PF) injection 10 mL (has no administration in time range)  The patient appears reasonably screen and/or stabilized for discharge and I doubt any other medical condition or other [facility] requiring further screening, evaluation, or treatment in the ED at this time prior to discharge.          Final Clinical Impression(s) / ED Diagnoses Final diagnoses:  Abscess    Rx / DC Orders ED Discharge Orders     None         Albertus Hughs, DO 07/02/23 [phone]

## 2023-07-02 NOTE — ED Notes (Signed)
 Chart in use else where pt has been discharged and is waiting in lobby. Will update chart when able.

## 2023-07-02 NOTE — ED Triage Notes (Signed)
 Recurrent abscess to left axilla.  Reports this one has been there for 3 days.

## 2023-07-02 NOTE — Discharge Instructions (Signed)
 Warm compresses at least 4x a day.  Follow up with your family doc in the office.  Follow up with gen surgery in the office.   Return for rapid spreading, fever. Take the antibiotics until they are completed.

## 2023-07-02 NOTE — ED Notes (Signed)
 Discharge paper and work excuse given to pt. Education and instruction revived and reinforced. Pt is leaving ED to wait in lobby for mother in no new onset distress at this time.

## 2023-07-17 DIAGNOSIS — L732 Hidradenitis suppurativa: Secondary | ICD-10-CM | POA: Diagnosis not present

## 2023-08-10 ENCOUNTER — Encounter (HOSPITAL_COMMUNITY): Payer: Self-pay

## 2023-08-10 ENCOUNTER — Ambulatory Visit (HOSPITAL_COMMUNITY)
Admission: EM | Admit: 2023-08-10 | Discharge: 2023-08-10 | Disposition: A | Discharge status: Home / Self Care | Attending: Internal Medicine | Admitting: Internal Medicine

## 2023-08-10 DIAGNOSIS — N61 Mastitis without abscess: Secondary | ICD-10-CM

## 2023-08-10 DIAGNOSIS — N6002 Solitary cyst of left breast: Secondary | ICD-10-CM | POA: Diagnosis not present

## 2023-08-10 DIAGNOSIS — Z3202 Encounter for pregnancy test, result negative: Secondary | ICD-10-CM

## 2023-08-10 LAB — POCT URINE PREGNANCY: Preg Test, Ur: NEGATIVE

## 2023-08-10 MED ORDER — AMOXICILLIN-POT CLAVULANATE 875-125 MG PO TABS: 1.0000 | ORAL_TABLET | Freq: Two times a day (BID) | ORAL | 0 refills | Status: DC

## 2023-08-10 NOTE — ED Triage Notes (Signed)
 Pt reports lump to L breast x2 days. Tender to touch. Non-reducible. No open sore. Of note pt's LMP was approx 3 months ago. Reports regular periods previously.

## 2023-08-10 NOTE — ED Provider Notes (Signed)
 [facility]    CSN: [id number] Arrival date & time: 08/10/23  1240      History   Chief Complaint Chief Complaint  Patient presents with   Breast Problem    HPI Christine [name] is a 23 y.o. female.   Christine [name] is a 23 y.o. female presenting for chief complaint of Breast Problem. She noticed a lump to the left breast at the 2 o'clock position of the left areola 2-3 days ago. She has noticed some redness and swelling to the surrounding area of the lump starting 2 to 3 days ago as well.  Denies drainage from the cyst, fever, chills, nausea, and vomiting.  History of hidradenitis suppurativa, she has never had a cyst or an abscess to the breast in the past.  Her maternal grandmother was recently diagnosed with breast cancer, no other family history of breast cancer or personal history of breast abnormality.  Denies recent injury and trauma to the breast, nipple discharge, other skin changes to the breast, other lumps of the breast, and changes in breast size from normal.  She was placed on doxycycline  antibiotic 6 weeks ago for axillary abscess, otherwise no recent antibiotic use.  Last menstrual cycle was 3 months ago on May 06, 2023, she is unsure of chance of pregnancy.  She has never had a mammogram or breast ultrasound in the past.     Past Medical History:  Diagnosis Date   Abscess    Asthma    Diabetes mellitus without complication [facility])     Patient Active Problem List   Diagnosis Date Noted   Screening for HIV (human immunodeficiency virus) 02/02/2021   Need for hepatitis C screening test 02/02/2021   Hydradenitis 08/04/2020   Temporomandibular joint (TMJ) pain 12/02/2019   Rapid weight loss 08/19/2019   Birth control counseling 02/23/2019   Menorrhagia with regular cycle 02/23/2019   Muscle spasm of left shoulder 02/23/2019   Asthma    Adjustment reaction to medical therapy    Healthcare maintenance 11/30/2014   Diabetes mellitus (HCC) 06/13/2013     Past Surgical History:  Procedure Laterality Date   ARM WOUND REPAIR / CLOSURE Right 12/2019   Right upper arm     OB History   No obstetric history on file.      Home Medications    Prior to Admission medications   Medication Sig Start Date End Date Taking? Authorizing Provider  amoxicillin -clavulanate (AUGMENTIN) 875-125 MG tablet Take 1 tablet by mouth every 12 (twelve) hours. 08/10/23  Yes Starlene Eaton, FNP  ACCU-CHEK AVIVA PLUS test strip USE TO TEST SIX TIMES DAILY Patient not taking: Reported on 02/13/2023 01/31/21   Ovidio Blower, MD  Clindamycin -Benzoyl Per, Refr, gel Apply twice daily as needed Patient not taking: Reported on 04/11/2021 02/02/21   Charmel Cooter, MD  Continuous Blood Gluc Transmit (DEXCOM G6 TRANSMITTER) MISC USE AS DIRECTED TO TEST BLOOD SUGAR 02/07/21   Ovidio Blower, MD  Continuous Glucose Receiver (DEXCOM G7 RECEIVER) DEVI 1 Units by Does not apply route daily. 09/14/22   Lavada Porteous, DO  Continuous Glucose Sensor (DEXCOM G7 SENSOR) MISC Place one sensor on skin. Replace every 10 days. 01/29/23   Jonne Netters, MD  doxycycline  (VIBRAMYCIN ) 100 MG capsule Take 1 capsule (100 mg total) by mouth 2 (two) times daily. 06/30/23   Reddick, Johnathan B, NP  Glucagon  (BAQSIMI  TWO PACK) 3 MG/DOSE POWD Place 1 spray into the nose as directed. 02/02/20   Ovidio Blower, MD  insulin  glargine (LANTUS ) 100 UNIT/ML Solostar Pen Inject 16 Units into the skin in the morning. 02/13/23   Lavada Porteous, DO  Insulin  Pen Needle (BD PEN NEEDLE NANO 2ND GEN) 32G X 4 MM MISC Please use to administer insulin . 09/27/22   Lavada Porteous, DO  liraglutide  (VICTOZA ) 18 MG/3ML SOPN Inject 1.2 mg into the skin daily. 03/19/23 05/03/23  Lavada Porteous, DO    Family History Family History  Problem Relation Age of Onset   Hypertension Mother    Diabetes Maternal Grandmother    Hypertension Maternal Grandmother    Diabetes Maternal Grandfather    Hypertension  Maternal Grandfather     Social History Social History   Tobacco Use   Smoking status: Never   Smokeless tobacco: Never  Substance Use Topics   Alcohol use: No     Allergies   Metformin  and related   Review of Systems Review of Systems Per HPI  Physical Exam Triage Vital Signs ED Triage Vitals  Encounter Vitals Group     BP 08/10/23 1415 129/81     Systolic BP Percentile --      Diastolic BP Percentile --      Pulse Rate 08/10/23 1415 76     Resp 08/10/23 1415 18     Temp 08/10/23 1415 98.1 F (36.7 C)     Temp Source 08/10/23 1415 Oral     SpO2 08/10/23 1415 98 %     Weight --      Height --      Head Circumference --      Peak Flow --      Pain Score 08/10/23 1413 0     Pain Loc --      Pain Education --      Exclude from Growth Chart --    No data found.  Updated Vital Signs BP 129/81 (BP Location: Left Arm)   Pulse 76   Temp 98.1 F (36.7 C) (Oral)   Resp 18   LMP 05/06/2023 (Approximate)   SpO2 98%   Visual Acuity Right Eye Distance:   Left Eye Distance:   Bilateral Distance:    Right Eye Near:   Left Eye Near:    Bilateral Near:     Physical Exam Vitals and nursing note reviewed. Exam conducted with a chaperone present.  Constitutional:      Appearance: She is not ill-appearing or toxic-appearing.  HENT:     Head: Normocephalic and atraumatic.     Right Ear: Hearing and external ear normal.     Left Ear: Hearing and external ear normal.     Nose: Nose normal.     Mouth/Throat:     Lips: Pink.  Eyes:     General: Lids are normal. Vision grossly intact. Gaze aligned appropriately.     Extraocular Movements: Extraocular movements intact.     Conjunctiva/sclera: Conjunctivae normal.  Pulmonary:     Effort: Pulmonary effort is normal.  Chest:  Breasts:    Right: Normal.     Left: Mass, skin change and tenderness present. No swelling, bleeding, inverted nipple or nipple discharge.    Musculoskeletal:     Cervical back: Neck  supple.  Skin:    General: Skin is warm and dry.     Capillary Refill: Capillary refill takes less than 2 seconds.     Findings: No rash.  Neurological:     General: No focal deficit present.     Mental Status: She is alert and oriented  to person, place, and time. Mental status is at baseline.     Cranial Nerves: No dysarthria or facial asymmetry.  Psychiatric:        Mood and Affect: Mood normal.        Speech: Speech normal.        Behavior: Behavior normal.        Thought Content: Thought content normal.        Judgment: Judgment normal.      UC Treatments / Results  Labs (all labs ordered are listed, but only abnormal results are displayed) Labs Reviewed  POCT URINE PREGNANCY    EKG   Radiology No results found.  Procedures Procedures (including critical care time)  Medications Ordered in UC Medications - No data to display  Initial Impression / Assessment and Plan / UC Course  I have reviewed the triage vital signs and the nursing notes.  Pertinent labs & imaging results that were available during my care of the patient were reviewed by me and considered in my medical decision making (see chart for details).   1.  Cellulitis of left breast, cyst of left breast, negative pregnancy test Presentation is consistent with infected cyst of left breast with overlying cellulitis. Will treat with Augmentin twice daily for 7 days, warm compresses, and Tylenol /ibuprofen  as needed for pain. No other masses palpable on breast exam. No red flags on exam. Diagnostic ultrasound and mammogram of the left breast ordered to be performed outpatient at [facility] imaging breast center to rule out underlying pathology/malignancy. She will receive a phone call in the next 3-5 business days from [place] imaging to schedule this. Advised to go to the ER if she develops fever, chills, vomiting, or worsening signs of infection to the left breast.   Counseled patient on potential for  adverse effects with medications prescribed/recommended today, strict ER and return-to-clinic precautions discussed, patient verbalized understanding.    Final Clinical Impressions(s) / UC Diagnoses   Final diagnoses:  Cyst of left breast  Cellulitis of left breast  Negative pregnancy test     Discharge Instructions      I suspect you may have an infection/cyst to the left breast. Take Augmentin antibiotic twice daily for the next 7 days. Use warm compresses to the breast to reduce inflammation and swelling. Take Tylenol  and ibuprofen  as needed for pain.  I would like for you to have a mammogram and ultrasound performed of the left breast to ensure that there are no other underlying abnormalities. You will receive a phone call from the [facility] imaging breast center in the next 3-5 business days to schedule this.  If you have not heard from [facility] imaging in the next 3 to 5 days, please call this clinic and we will help to coordinate this.  If you develop any new or worsening symptoms such as worsening redness, swelling, pus drainage from the cyst, nipple drainage, or new breast changes, please go to the nearest emergency room for further evaluation.  Otherwise, follow-up with your primary care provider and have ultrasound and mammogram performed of the left breast.  I hope you feel better!   ED Prescriptions     Medication Sig Dispense Auth. Provider   amoxicillin -clavulanate (AUGMENTIN) 875-125 MG tablet Take 1 tablet by mouth every 12 (twelve) hours. 14 tablet Starlene Eaton, FNP      PDMP not reviewed this encounter.   Starlene Eaton, [place] 08/10/23 1534

## 2023-08-10 NOTE — Discharge Instructions (Addendum)
 I suspect you may have an infection/cyst to the left breast. Take Augmentin antibiotic twice daily for the next 7 days. Use warm compresses to the breast to reduce inflammation and swelling. Take Tylenol  and ibuprofen  as needed for pain.  I would like for you to have a mammogram and ultrasound performed of the left breast to ensure that there are no other underlying abnormalities. You will receive a phone call from the [facility] imaging breast center in the next 3-5 business days to schedule this.  If you have not heard from [facility] imaging in the next 3 to 5 days, please call this clinic and we will help to coordinate this.  If you develop any new or worsening symptoms such as worsening redness, swelling, pus drainage from the cyst, nipple drainage, or new breast changes, please go to the nearest emergency room for further evaluation.  Otherwise, follow-up with your primary care provider and have ultrasound and mammogram performed of the left breast.  I hope you feel better!

## 2023-08-24 NOTE — Progress Notes (Signed)
 Surgical Instructions   Your procedure is scheduled on Friday August 31, 2023. Report to [facility] Main Entrance A at 6:45 A.M., then check in with the Admitting office. Any questions or running late day of surgery: call [phone]  Questions prior to your surgery date: call [phone], Monday-Friday, 8am-4pm. If you experience any cold or flu symptoms such as cough, fever, chills, shortness of breath, etc. between now and your scheduled surgery, please notify us  at the above number.     Remember:  Do not eat after midnight the night before your surgery   You may drink clear liquids until 5:45 the morning of your surgery.   Clear liquids allowed are: Water , Non-Citrus Juices (without pulp), Carbonated Beverages, Clear Tea (no milk, honey, etc.), Black Coffee Only (NO MILK, CREAM OR POWDERED CREAMER of any kind), and Gatorade.    Take these medicines the morning of surgery with A SIP OF WATER : NONE   One week prior to surgery, STOP taking any Aspirin (unless otherwise instructed by your surgeon) Aleve , Naproxen , Ibuprofen , Motrin , Advil , Goody's, BC's, all herbal medications, fish oil, and non-prescription vitamins.   WHAT DO I DO ABOUT MY DIABETES MEDICATION?   Do not take oral diabetes medicines (pills) the morning of surgery.  THE MORNING OF SURGERY take 4 units of insulin  glargine (LANTUS ) which is 50% of your regular dose.   The day of surgery, do not take other diabetes injectables, including Byetta (exenatide), Bydureon (exenatide ER), Victoza  (liraglutide ), or Trulicity (dulaglutide).  If your CBG is greater than 220 mg/dL, you may take  of your sliding scale (correction) dose of insulin .   HOW TO MANAGE YOUR DIABETES BEFORE AND AFTER SURGERY  Why is it important to control my blood sugar before and after surgery? Improving blood sugar levels before and after surgery helps healing and can limit problems. A way of improving blood sugar control is eating a healthy  diet by:  Eating less sugar and carbohydrates  Increasing activity/exercise  Talking with your doctor about reaching your blood sugar goals High blood sugars (greater than 180 mg/dL) can raise your risk of infections and slow your recovery, so you will need to focus on controlling your diabetes during the weeks before surgery. Make sure that the doctor who takes care of your diabetes knows about your planned surgery including the date and location.  How do I manage my blood sugar before surgery? Check your blood sugar at least 4 times a day, starting 2 days before surgery, to make sure that the level is not too high or low.  Check your blood sugar the morning of your surgery when you wake up and every 2 hours until you get to the Short Stay unit.  If your blood sugar is less than 70 mg/dL, you will need to treat for low blood sugar: Do not take insulin . Treat a low blood sugar (less than 70 mg/dL) with  cup of clear juice (cranberry or apple), 4 glucose tablets, OR glucose gel. Recheck blood sugar in 15 minutes after treatment (to make sure it is greater than 70 mg/dL). If your blood sugar is not greater than 70 mg/dL on recheck, call [phone] for further instructions. Report your blood sugar to the short stay nurse when you get to Short Stay.  If you are admitted to the hospital after surgery: Your blood sugar will be checked by the staff and you will probably be given insulin  after surgery (instead of oral diabetes medicines) to make sure you  have good blood sugar levels. The goal for blood sugar control after surgery is 80-180 mg/dL.                      Do NOT Smoke (Tobacco/Vaping) for 24 hours prior to your procedure.  If you use a CPAP at night, you may bring your mask/headgear for your overnight stay.   You will be asked to remove any contacts, glasses, piercing's, hearing aid's, dentures/partials prior to surgery. Please bring cases for these items if needed.    Patients  discharged the day of surgery will not be allowed to drive home, and someone needs to stay with them for 24 hours.  SURGICAL WAITING ROOM VISITATION Patients may have no more than 2 support people in the waiting area - these visitors may rotate.   Pre-op nurse will coordinate an appropriate time for 1 ADULT support person, who may not rotate, to accompany patient in pre-op.  Children under the age of 34 must have an adult with them who is not the patient and must remain in the main waiting area with an adult.  If the patient needs to stay at the hospital during part of their recovery, the visitor guidelines for inpatient rooms apply.  Please refer to the [facility] website for the visitor guidelines for any additional information.   If you received a COVID test during your pre-op visit  it is requested that you wear a mask when out in public, stay away from anyone that may not be feeling well and notify your surgeon if you develop symptoms. If you have been in contact with anyone that has tested positive in the last 10 days please notify you surgeon.      Pre-operative CHG Bathing Instructions   You can play a key role in reducing the risk of infection after surgery. Your skin needs to be as free of germs as possible. You can reduce the number of germs on your skin by washing with CHG (chlorhexidine gluconate) soap before surgery. CHG is an antiseptic soap that kills germs and continues to kill germs even after washing.   DO NOT use if you have an allergy to chlorhexidine/CHG or antibacterial soaps. If your skin becomes reddened or irritated, stop using the CHG and notify one of our RNs at [phone].              TAKE A SHOWER THE NIGHT BEFORE SURGERY AND THE DAY OF SURGERY    Please keep in mind the following:  DO NOT shave, including legs and underarms, 48 hours prior to surgery.   You may shave your face before/day of surgery.  Place clean sheets on your bed the night before  surgery Use a clean washcloth (not used since being washed) for each shower. DO NOT sleep with pet's night before surgery.  CHG Shower Instructions:  Wash your face and private area with normal soap. If you choose to wash your hair, wash first with your normal shampoo.  After you use shampoo/soap, rinse your hair and body thoroughly to remove shampoo/soap residue.  Turn the water  OFF and apply half the bottle of CHG soap to a CLEAN washcloth.  Apply CHG soap ONLY FROM YOUR NECK DOWN TO YOUR TOES (washing for 3-5 minutes)  DO NOT use CHG soap on face, private areas, open wounds, or sores.  Pay special attention to the area where your surgery is being performed.  If you are having back surgery, having someone  wash your back for you may be helpful. Wait 2 minutes after CHG soap is applied, then you may rinse off the CHG soap.  Pat dry with a clean towel  Put on clean pajamas    Additional instructions for the day of surgery: DO NOT APPLY any lotions, deodorants, cologne, or perfumes.   Do not wear jewelry or makeup Do not wear nail polish, gel polish, artificial nails, or any other type of covering on natural nails (fingers and toes) Do not bring valuables to the hospital. [facility] is not responsible for valuables/personal belongings. Put on clean/comfortable clothes.  Please brush your teeth.  Ask your nurse before applying any prescription medications to the skin.

## 2023-08-27 ENCOUNTER — Encounter (HOSPITAL_COMMUNITY)
Admission: RE | Admit: 2023-08-27 | Discharge: 2023-08-27 | Disposition: A | Discharge status: Home / Self Care | Source: Ambulatory Visit | Attending: General Surgery | Admitting: General Surgery

## 2023-08-27 ENCOUNTER — Encounter (HOSPITAL_COMMUNITY): Payer: Self-pay

## 2023-08-27 ENCOUNTER — Other Ambulatory Visit: Payer: Self-pay

## 2023-08-27 VITALS — BP 130/73 | HR 84 | Temp 98.5°F | Resp 16 | Ht 66.0 in | Wt 190.6 lb

## 2023-08-27 DIAGNOSIS — Z01818 Encounter for other preprocedural examination: Secondary | ICD-10-CM | POA: Diagnosis not present

## 2023-08-27 DIAGNOSIS — E11628 Type 2 diabetes mellitus with other skin complications: Secondary | ICD-10-CM | POA: Insufficient documentation

## 2023-08-27 LAB — BASIC METABOLIC PANEL WITH GFR
Anion gap: 11 (ref 5–15)
BUN: <5 mg/dL — ABNORMAL LOW (ref 6–20)
CO2: 23 mmol/L (ref 22–32)
Calcium: 8.6 mg/dL — ABNORMAL LOW (ref 8.9–10.3)
Chloride: 97 mmol/L — ABNORMAL LOW (ref 98–111)
Creatinine, Ser: 0.72 mg/dL (ref 0.44–1.00)
GFR, Estimated: >60 mL/min (ref >60)
Glucose, Bld: 443 mg/dL — ABNORMAL HIGH (ref 70–99)
Potassium: 4 mmol/L (ref 3.5–5.1)
Sodium: 131 mmol/L — ABNORMAL LOW (ref 135–145)

## 2023-08-27 LAB — GLUCOSE, CAPILLARY: Glucose-Capillary: 464 mg/dL — ABNORMAL HIGH (ref 70–99)

## 2023-08-27 LAB — CBC
HCT: 35.8 % — ABNORMAL LOW (ref 36.0–46.0)
Hemoglobin: 11.4 g/dL — ABNORMAL LOW (ref 12.0–15.0)
MCH: 24.8 pg — ABNORMAL LOW (ref 26.0–34.0)
MCHC: 31.8 g/dL (ref 30.0–36.0)
MCV: 78 fL — ABNORMAL LOW (ref 80.0–100.0)
Platelets: 418 10*3/uL — ABNORMAL HIGH (ref 150–400)
RBC: 4.59 MIL/uL (ref 3.87–5.11)
RDW: 13.8 % (ref 11.5–15.5)
WBC: 9.2 10*3/uL (ref 4.0–10.5)
nRBC: 0 % (ref 0.0–0.2)

## 2023-08-27 LAB — HEMOGLOBIN A1C
Hgb A1c MFr Bld: 13.8 % — ABNORMAL HIGH (ref 4.8–5.6)
Mean Plasma Glucose: 349.36 mg/dL

## 2023-08-27 NOTE — Progress Notes (Signed)
 PCP - Pt couldn't remember name--when searching chart, patient was dismissed from Dr. Otto Fairly in Feb 2025 due to multiple missed appts. Letter sent from PCP office, pt denies being aware of this. Strongly encouraged pt to establish PCP and/or endocrinology care for diabetes management.  Cardiologist - denies  Chest x-ray -  EKG - 08/27/23 Stress Test - denies ECHO - denies Cardiac Cath - denies  Sleep Study - denies CPAP -   Fasting Blood Sugar - 200's Checks Blood Sugar QID Pt states she was originally diagnosed as a Type I diabetic, but is now Type II CBG at appt 464, ate at work ([employer]) prior to arrival with cheeseburger, fries, cookie, sweet tea. Lynwood Hope PA-C notified. Will f/u on other labs obtained. Also encouraged hydration today and establishing PCP care. Pt denies having a history of DKA. Only takes Lantus  in the morning.    Blood Thinner Instructions: n/a Aspirin Instructions:  ERAS Protcol - clears until 545   Anesthesia review: Yes, A1C & CBG 464  Patient denies shortness of breath, fever, cough and chest pain at PAT appointment

## 2023-08-28 ENCOUNTER — Other Ambulatory Visit

## 2023-08-28 ENCOUNTER — Encounter (HOSPITAL_COMMUNITY): Payer: Self-pay | Admitting: Physician Assistant

## 2023-08-29 LAB — NASOPHARYNGEAL CULTURE

## 2023-08-30 DIAGNOSIS — E559 Vitamin D deficiency, unspecified: Secondary | ICD-10-CM | POA: Diagnosis not present

## 2023-08-30 DIAGNOSIS — D539 Nutritional anemia, unspecified: Secondary | ICD-10-CM | POA: Diagnosis not present

## 2023-08-30 DIAGNOSIS — A499 Bacterial infection, unspecified: Secondary | ICD-10-CM | POA: Diagnosis not present

## 2023-08-30 DIAGNOSIS — Z6831 Body mass index (BMI) 31.0-31.9, adult: Secondary | ICD-10-CM | POA: Diagnosis not present

## 2023-08-30 DIAGNOSIS — L02422 Furuncle of left axilla: Secondary | ICD-10-CM | POA: Diagnosis not present

## 2023-08-30 DIAGNOSIS — E119 Type 2 diabetes mellitus without complications: Secondary | ICD-10-CM | POA: Diagnosis not present

## 2023-08-30 DIAGNOSIS — E1165 Type 2 diabetes mellitus with hyperglycemia: Secondary | ICD-10-CM | POA: Diagnosis not present

## 2023-08-30 DIAGNOSIS — R5383 Other fatigue: Secondary | ICD-10-CM | POA: Diagnosis not present

## 2023-08-30 DIAGNOSIS — Z0000 Encounter for general adult medical examination without abnormal findings: Secondary | ICD-10-CM | POA: Diagnosis not present

## 2023-08-30 DIAGNOSIS — Z794 Long term (current) use of insulin: Secondary | ICD-10-CM | POA: Diagnosis not present

## 2023-08-30 DIAGNOSIS — N390 Urinary tract infection, site not specified: Secondary | ICD-10-CM | POA: Diagnosis not present

## 2023-08-31 ENCOUNTER — Encounter (HOSPITAL_COMMUNITY): Admission: RE | Payer: Self-pay | Source: Home / Self Care

## 2023-08-31 ENCOUNTER — Ambulatory Visit (HOSPITAL_COMMUNITY): Admission: RE | Admit: 2023-08-31 | Source: Home / Self Care | Admitting: General Surgery

## 2023-08-31 SURGERY — EXCISION, HIDRADENITIS, AXILLA
Anesthesia: Choice | Laterality: Left

## 2023-09-04 ENCOUNTER — Ambulatory Visit
Admission: RE | Admit: 2023-09-04 | Discharge: 2023-09-04 | Disposition: A | Discharge status: Home / Self Care | Source: Ambulatory Visit | Attending: Internal Medicine | Admitting: Internal Medicine

## 2023-09-04 DIAGNOSIS — N6321 Unspecified lump in the left breast, upper outer quadrant: Secondary | ICD-10-CM | POA: Diagnosis not present

## 2023-09-05 ENCOUNTER — Ambulatory Visit (HOSPITAL_COMMUNITY): Payer: Self-pay

## 2023-09-05 ENCOUNTER — Other Ambulatory Visit: Payer: Self-pay | Admitting: Internal Medicine

## 2023-09-05 DIAGNOSIS — N6320 Unspecified lump in the left breast, unspecified quadrant: Secondary | ICD-10-CM

## 2023-09-17 ENCOUNTER — Encounter: Payer: Self-pay | Admitting: Internal Medicine

## 2023-10-08 DIAGNOSIS — Z7251 High risk heterosexual behavior: Secondary | ICD-10-CM | POA: Diagnosis not present

## 2023-10-08 DIAGNOSIS — Z3201 Encounter for pregnancy test, result positive: Secondary | ICD-10-CM | POA: Diagnosis not present

## 2023-10-08 DIAGNOSIS — E1165 Type 2 diabetes mellitus with hyperglycemia: Secondary | ICD-10-CM | POA: Diagnosis not present

## 2023-10-08 DIAGNOSIS — Z794 Long term (current) use of insulin: Secondary | ICD-10-CM | POA: Diagnosis not present

## 2023-10-08 DIAGNOSIS — Z3200 Encounter for pregnancy test, result unknown: Secondary | ICD-10-CM | POA: Diagnosis not present

## 2023-10-08 DIAGNOSIS — Z6832 Body mass index (BMI) 32.0-32.9, adult: Secondary | ICD-10-CM | POA: Diagnosis not present

## 2023-10-08 DIAGNOSIS — N898 Other specified noninflammatory disorders of vagina: Secondary | ICD-10-CM | POA: Diagnosis not present

## 2023-10-08 DIAGNOSIS — B9689 Other specified bacterial agents as the cause of diseases classified elsewhere: Secondary | ICD-10-CM | POA: Diagnosis not present

## 2023-10-22 ENCOUNTER — Encounter: Payer: Self-pay | Admitting: *Deleted

## 2023-10-23 ENCOUNTER — Ambulatory Visit: Admitting: *Deleted

## 2023-10-23 ENCOUNTER — Other Ambulatory Visit (INDEPENDENT_AMBULATORY_CARE_PROVIDER_SITE_OTHER): Payer: Self-pay

## 2023-10-23 VITALS — BP 120/76 | HR 79

## 2023-10-23 DIAGNOSIS — O3680X0 Pregnancy with inconclusive fetal viability, not applicable or unspecified: Secondary | ICD-10-CM

## 2023-10-23 DIAGNOSIS — O0990 Supervision of high risk pregnancy, unspecified, unspecified trimester: Secondary | ICD-10-CM | POA: Diagnosis not present

## 2023-10-23 DIAGNOSIS — Z3A11 11 weeks gestation of pregnancy: Secondary | ICD-10-CM | POA: Diagnosis not present

## 2023-10-23 DIAGNOSIS — O0991 Supervision of high risk pregnancy, unspecified, first trimester: Secondary | ICD-10-CM | POA: Diagnosis not present

## 2023-10-23 MED ORDER — VITAFOL GUMMIES 3.33-0.333-34.8 MG PO CHEW: 1.0000 | CHEWABLE_TABLET | Freq: Every day | ORAL | 5 refills | Status: AC

## 2023-10-23 MED ORDER — BLOOD PRESSURE KIT DEVI: 1.0000 | 0 refills | Status: DC

## 2023-10-23 NOTE — Progress Notes (Signed)
 New OB Intake  I connected with Christine [name]  on 10/24/23 at  1:10 PM EDT by In Person Visit and verified that I am speaking with the correct person using two identifiers. Nurse is located at [facility] and pt is located at [facility].  I discussed the limitations, risks, security and privacy concerns of performing an evaluation and management service by telephone and the availability of in person appointments. I also discussed with the patient that there may be a patient responsible charge related to this service. The patient expressed understanding and agreed to proceed.  I explained I am completing New OB Intake today. We discussed EDD of 05/09/24 based on US  at [redacted]w[redacted]d weeks. Pt is G1P0000. I reviewed her allergies, medications and Medical/Surgical/OB history.    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 Discussed the nature of our practice with multiple providers including residents and students as well as female and female providers. Due to the size of the practice, the delivering provider may not be the same as those providing prenatal care.   Patient is not interested in water  birth.  MyChart/Babyscripts MyChart access verified. I explained pt will have some visits in office and some virtually. Babyscripts instructions given and order placed. Patient verifies receipt of registration text/e-mail. Account successfully created and app downloaded. If patient is a candidate for Optimized scheduling, add to sticky note.   Blood Pressure Cuff/Weight Scale Blood pressure cuff ordered for patient to pick-up from [employer]. Explained after first prenatal appt pt will check weekly and document in Babyscripts. Patient does not have weight scale; patient may purchase if they desire to track weight weekly in Babyscripts.  Anatomy US  Explained first scheduled US  will be around 19 weeks. Anatomy US  scheduled for 12/19/23 at 2:15 pm.  Is patient a candidate for Babyscripts Optimization? No, due to Risk Factors   First visit review I reviewed new OB appt with patient. Explained pt will be seen by Dr. Abigail at first visit. Discussed Christine [name] genetic screening with patient. Requests [employer] and [employer].. Routine prenatal labs collected at today's visit.   Last Pap No results found for: DIAGPAP  Christine CHRISTELLA Ober, RN 10/24/2023  5:51 PM

## 2023-10-24 ENCOUNTER — Encounter: Payer: Self-pay | Admitting: Obstetrics and Gynecology

## 2023-10-24 ENCOUNTER — Other Ambulatory Visit (HOSPITAL_COMMUNITY)
Admission: RE | Admit: 2023-10-24 | Discharge: 2023-10-24 | Disposition: A | Discharge status: Home / Self Care | Source: Ambulatory Visit | Attending: Obstetrics and Gynecology | Admitting: Obstetrics and Gynecology

## 2023-10-24 ENCOUNTER — Ambulatory Visit (INDEPENDENT_AMBULATORY_CARE_PROVIDER_SITE_OTHER): Admitting: Obstetrics and Gynecology

## 2023-10-24 VITALS — BP 125/83 | HR 87 | Wt 209.0 lb

## 2023-10-24 DIAGNOSIS — O24119 Pre-existing diabetes mellitus, type 2, in pregnancy, unspecified trimester: Secondary | ICD-10-CM | POA: Insufficient documentation

## 2023-10-24 DIAGNOSIS — Z3A11 11 weeks gestation of pregnancy: Secondary | ICD-10-CM | POA: Insufficient documentation

## 2023-10-24 DIAGNOSIS — O0990 Supervision of high risk pregnancy, unspecified, unspecified trimester: Secondary | ICD-10-CM | POA: Diagnosis not present

## 2023-10-24 DIAGNOSIS — Z124 Encounter for screening for malignant neoplasm of cervix: Secondary | ICD-10-CM

## 2023-10-24 DIAGNOSIS — O24111 Pre-existing diabetes mellitus, type 2, in pregnancy, first trimester: Secondary | ICD-10-CM

## 2023-10-24 LAB — CBC/D/PLT+RPR+RH+ABO+RUBIGG...
Antibody Screen: NEGATIVE
Basophils Absolute: 0 x10E3/uL (ref 0.0–0.2)
Basos: 0 %
EOS (ABSOLUTE): 0.1 x10E3/uL (ref 0.0–0.4)
Eos: 1 %
HCV Ab: NONREACTIVE
HIV Screen 4th Generation wRfx: NONREACTIVE
Hematocrit: 34.1 % (ref 34.0–46.6)
Hemoglobin: 10.3 g/dL — ABNORMAL LOW (ref 11.1–15.9)
Hepatitis B Surface Ag: NEGATIVE
Immature Grans (Abs): 0 x10E3/uL (ref 0.0–0.1)
Immature Granulocytes: 0 %
Lymphocytes Absolute: 2.3 x10E3/uL (ref 0.7–3.1)
Lymphs: 25 %
MCH: 25.4 pg — ABNORMAL LOW (ref 26.6–33.0)
MCHC: 30.2 g/dL — ABNORMAL LOW (ref 31.5–35.7)
MCV: 84 fL (ref 79–97)
Monocytes Absolute: 0.7 x10E3/uL (ref 0.1–0.9)
Monocytes: 7 %
Neutrophils Absolute: 6.1 x10E3/uL (ref 1.4–7.0)
Neutrophils: 67 %
Platelets: 447 x10E3/uL (ref 150–450)
RBC: 4.06 x10E6/uL (ref 3.77–5.28)
RDW: 13.4 % (ref 11.7–15.4)
RPR Ser Ql: NONREACTIVE
Rh Factor: POSITIVE
Rubella Antibodies, IGG: <0.9 {index} — ABNORMAL LOW (ref >0.99)
WBC: 9.2 x10E3/uL (ref 3.4–10.8)

## 2023-10-24 LAB — COMPREHENSIVE METABOLIC PANEL WITH GFR
ALT: 9 IU/L (ref 0–32)
AST: 12 IU/L (ref 0–40)
Albumin: 4.2 g/dL (ref 4.0–5.0)
Alkaline Phosphatase: 63 IU/L (ref 44–121)
BUN/Creatinine Ratio: 10 (ref 9–23)
BUN: 6 mg/dL (ref 6–20)
Bilirubin Total: <0.2 mg/dL (ref 0.0–1.2)
CO2: 19 mmol/L — ABNORMAL LOW (ref 20–29)
Calcium: 9.5 mg/dL (ref 8.7–10.2)
Chloride: 102 mmol/L (ref 96–106)
Creatinine, Ser: 0.6 mg/dL (ref 0.57–1.00)
Globulin, Total: 3.2 g/dL (ref 1.5–4.5)
Glucose: 98 mg/dL (ref 70–99)
Potassium: 4.3 mmol/L (ref 3.5–5.2)
Sodium: 135 mmol/L (ref 134–144)
Total Protein: 7.4 g/dL (ref 6.0–8.5)
eGFR: 131 mL/min/1.73 (ref >59)

## 2023-10-24 LAB — HCV INTERPRETATION

## 2023-10-24 MED ORDER — ASPIRIN 81 MG PO TBEC: 81.0000 mg | DELAYED_RELEASE_TABLET | Freq: Every day | ORAL | 2 refills | Status: AC

## 2023-10-24 NOTE — Progress Notes (Signed)
 Chief Complaint  Patient presents with   Initial Prenatal Visit    Subjective:   Christine [name] is a 23 y.o. G1P0000 at [redacted]w[redacted]d by early ultrasound being seen today for her first obstetrical visit.  Her obstetrical history is significant for none, first pregnancy. Patient is undecided about whether she does intend to breast feed. Pregnancy history fully reviewed.  Pregnancy complicated by: T2DM: diagnosed age 87, uncontrolled, last A1c 13.8, currently on lantus  8 units qam Asthma- remote history, no active inhaler use and last used inhaler years ago  Report BS control has been doing better lately. She uses a dexcom but is unable to access all her BS measurements at today's visit. She reports BS usually between 80-200. Reports previously it was 200-300s. She has never seen an eye doctor  Patient reports no nausea/vomiting, bleeding/cramping.  HISTORY: OB History  Gravida Para Term Preterm AB Living  1 0 0 0 0 0  SAB IAB Ectopic Multiple Live Births  0 0 0 0 0    # Outcome Date GA Lbr Len/2nd Weight Sex Type Anes PTL Lv  1 Current              Last pap smear: No results found for: DIAGPAP, HPV, HPVHIGH   Past Medical History:  Diagnosis Date   Abscess    Asthma    Diabetes mellitus without complication (HCC)    Past Surgical History:  Procedure Laterality Date   ARM WOUND REPAIR / CLOSURE Right 12/2019   Right upper arm    CHOLECYSTECTOMY     Family History  Problem Relation Age of Onset   Hypertension Mother    Healthy Father    Diabetes Maternal Grandmother    Hypertension Maternal Grandmother    Diabetes Maternal Grandfather    Hypertension Maternal Grandfather    Social History   Tobacco Use   Smoking status: Never   Smokeless tobacco: Never  Substance Use Topics   Alcohol use: No   Drug use: Not Currently   Allergies  Allergen Reactions   Metformin  And Related Hives and Other (See Comments)    constipation   Current Outpatient Medications  on File Prior to Visit  Medication Sig Dispense Refill   Blood Pressure Monitoring (BLOOD PRESSURE KIT) DEVI 1 Device by Does not apply route once a week. 1 each 0   Continuous Blood Gluc Transmit (DEXCOM G6 TRANSMITTER) MISC USE AS DIRECTED TO TEST BLOOD SUGAR 1 each 3   Continuous Glucose Receiver (DEXCOM G7 RECEIVER) DEVI 1 Units by Does not apply route daily. 1 each 0   Continuous Glucose Sensor (DEXCOM G7 SENSOR) MISC Place one sensor on skin. Replace every 10 days. 3 each 2   insulin  glargine (LANTUS ) 100 UNIT/ML Solostar Pen Inject 16 Units into the skin in the morning. (Patient taking differently: Inject 8 Units into the skin in the morning.) 3 mL 1   Insulin  Pen Needle (BD PEN NEEDLE NANO 2ND GEN) 32G X 4 MM MISC Please use to administer insulin . 200 each 5   Prenatal Vit-Fe Phos-FA-Omega (VITAFOL  GUMMIES) 3.33-0.333-34.8 MG CHEW Chew 1 tablet by mouth daily. 90 tablet 5   No current facility-administered medications on file prior to visit.     Exam   Vitals:   10/24/23 1003  BP: 125/83  Pulse: 87  Weight: 209 lb (94.8 kg)     +FCA and fetal movement on bedside ultrasound, unable to get heart tones by doptones General:  Alert, oriented and cooperative.  Patient is in no acute distress.  Breast: deferred  Cardiovascular: Normal heart rate noted  Respiratory: Normal respiratory effort, no problems with respiration noted  Abdomen: Soft, gravid, appropriate for gestational age.  Pain/Pressure: Absent     Pelvic: Cervical exam performed in the presence of a chaperone       EGBUS within normal limits Vagina normal Cervix without lesions Pap collected  Extremities: Normal range of motion.  Edema: Trace  Mental Status: Normal mood and affect. Normal behavior. Normal judgment and thought content.    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 Supervision of high risk pregnancy, antepartum (Primary) - Cytology - PAP( [place]) - US  MFM OB DETAIL +14 WK; Future - US  Fetal Echocardiography; Future  2. Pre-existing type 2 diabetes mellitus during pregnancy in first trimester Reviewed diabetes in pregnancy and need for good glucose control. Seems like she is doing better than she had previously however likely still requires better control. Unable to completely access her glucose values for more specific review today. I have advised her to record her glucose log fasting and 2 hr postprandials and review in 1 week for review. We reviewed targeted ranges Reviewed serial growth and antenatal testing in third trimester Start aspirin  after 12 weeks, rx ordered today - Last A1c 13.8 on 08/27/23 - Current regimen: lantus  8 units qam [X]  diabetes/nutrition consult ordered today [X]  TSH ordered today (last TSH 2022) [X]  normal EKG 08/2023 [X]  fetal echo- ordered today [X]  optho referral today - Protein / creatinine ratio, urine - TSH Rfx on Abnormal to Free T4 - aspirin  EC 81 MG tablet; Take 1 tablet (81 mg total) by mouth at bedtime. Start taking when you are [redacted] weeks pregnant for rest of pregnancy for prevention of preeclampsia  Dispense: 300 tablet; Refill: 2 - Referral to Nutrition and Diabetes Services - US  Fetal Echocardiography; Future - Ambulatory referral to Ophthalmology  3. Cervical cancer  screening Pap  4. [redacted] weeks gestation of pregnancy - Cytology - PAP( [place]) - US  MFM OB DETAIL +14 WK; Future    Initial labs drawn at intake yesterday Continue prenatal vitamins. Genetic Screening discussed: NIPS, carrier screening and AFP, ordered. Ultrasound discussed; fetal anatomic survey: ordered. Problem list reviewed and updated. The nature of [place] [facility] with multiple MDs and other Advanced Practice Providers was explained to patient; also emphasized that residents, students are part of our team. Routine obstetric precautions reviewed. Return in about 1 week (around 10/31/2023).  Rollo ONEIDA Bring, MD, FACOG Obstetrician & Gynecologist, [facility] for [facility], [facility]

## 2023-10-24 NOTE — Progress Notes (Signed)
 NOB.  T2DM.  Pt is using Dexcom and maintains that her numbers are normal.

## 2023-10-25 ENCOUNTER — Ambulatory Visit: Payer: Self-pay | Admitting: Obstetrics and Gynecology

## 2023-10-25 LAB — PROTEIN / CREATININE RATIO, URINE
Creatinine, Urine: 84.7 mg/dL
Protein, Ur: 12.1 mg/dL
Protein/Creat Ratio: 143 mg/g{creat} (ref 0–200)

## 2023-10-25 LAB — TSH RFX ON ABNORMAL TO FREE T4: TSH: 1.03 u[IU]/mL (ref 0.450–4.500)

## 2023-10-26 LAB — CYTOLOGY - PAP: Diagnosis: NEGATIVE

## 2023-10-27 LAB — CULTURE, OB URINE

## 2023-10-27 LAB — URINE CULTURE, OB REFLEX

## 2023-10-28 LAB — PANORAMA PRENATAL TEST FULL PANEL:PANORAMA TEST PLUS 5 ADDITIONAL MICRODELETIONS: FETAL FRACTION: 7.2

## 2023-10-29 ENCOUNTER — Ambulatory Visit: Payer: Self-pay | Admitting: Obstetrics and Gynecology

## 2023-10-29 DIAGNOSIS — N3000 Acute cystitis without hematuria: Secondary | ICD-10-CM

## 2023-10-29 DIAGNOSIS — R898 Other abnormal findings in specimens from other organs, systems and tissues: Secondary | ICD-10-CM

## 2023-10-29 MED ORDER — CEFADROXIL 500 MG PO CAPS: 500.0000 mg | ORAL_CAPSULE | Freq: Two times a day (BID) | ORAL | 0 refills | Status: DC

## 2023-10-31 ENCOUNTER — Ambulatory Visit

## 2023-11-03 LAB — HORIZON CUSTOM: REPORT SUMMARY: POSITIVE — AB

## 2023-11-06 ENCOUNTER — Encounter: Admitting: Obstetrics and Gynecology

## 2023-11-07 ENCOUNTER — Encounter: Admitting: Obstetrics & Gynecology

## 2023-11-08 ENCOUNTER — Encounter: Admitting: Physician Assistant

## 2023-11-13 ENCOUNTER — Other Ambulatory Visit: Payer: Self-pay

## 2023-11-13 ENCOUNTER — Ambulatory Visit: Admitting: Dietician

## 2023-11-13 ENCOUNTER — Encounter: Attending: Obstetrics and Gynecology | Admitting: Dietician

## 2023-11-13 DIAGNOSIS — Z3A14 14 weeks gestation of pregnancy: Secondary | ICD-10-CM

## 2023-11-13 DIAGNOSIS — E119 Type 2 diabetes mellitus without complications: Secondary | ICD-10-CM | POA: Insufficient documentation

## 2023-11-13 DIAGNOSIS — Z794 Long term (current) use of insulin: Secondary | ICD-10-CM | POA: Diagnosis present

## 2023-11-13 DIAGNOSIS — O24111 Pre-existing diabetes mellitus, type 2, in pregnancy, first trimester: Secondary | ICD-10-CM

## 2023-11-13 DIAGNOSIS — O24112 Pre-existing diabetes mellitus, type 2, in pregnancy, second trimester: Secondary | ICD-10-CM

## 2023-11-13 MED ORDER — OMNIPOD 5 DEXG7G6 INTRO GEN 5 KIT: 1.0000 [IU] | PACK | Freq: Once | 0 refills | Status: AC

## 2023-11-13 MED ORDER — INSULIN ASPART 100 UNIT/ML IJ SOLN: INTRAMUSCULAR | 11 refills | Status: DC

## 2023-11-13 MED ORDER — OMNIPOD 5 DEXG7G6 PODS GEN 5 MISC: 11 refills | Status: DC

## 2023-11-13 NOTE — Progress Notes (Signed)
 Patient was seen for Pre-existing Diabetes During Pregnancy on 11/13/2023  Start time 1022 and End time 1123   Estimated due date: 05/10/2023; [redacted]w[redacted]d  Wt Readings from Last 3 Encounters:  10/24/23 209 lb (94.8 kg)  08/27/23 190 lb 9.6 oz (86.5 kg)  07/02/23 199 lb (90.3 kg)  Uncontrolled type 2 diabetes 08/2023.  She made changes at that time which significantly improved her blood glucose.  Patient now with fasting glucose within range but post prandial glucose out of range for pregnancy.  Patient states that she has problems remembering to take her insulin .  She was shown the Omnipod 5 insulin  pump.  She states that she heard about this in high school and would like to get this.  Discussed with MD and this has been ordered for cosign along with a prescription for Novolog  in a vial for use in an insulin  pump.   She has [employer].  She is currently using a Dexcom G7 CGM. She would also like to be added to the Type 1 Diabetes Support Group as she fits with this group due best compared to the Type 2 group. She has had no education on diabetes since she was 23 yo and in the hospital due to new diagnosis of diabetes.  Clinical: Medications: Lantus  8 units q am Medical History: Type 2 Diabetes (2016) Labs: A1c 13.8% on 08/27/2023, c-peptide 0.33 on 01/22/2018, negative autoantibodies (2016)  Dietary and Lifestyle History: Patient lives with her mother.  Her mother does the cooking.  Patient works at [employer].  Physical Activity: none Stress: fair Sleep: good  24 hr Recall:  First Meal:  orange or banana Snack:  none Second meal:  Mc[employer] sandwich Snack:  salad and vanilla yogurt  Third meal:  potato, cheese and sour cream Snack:  cheetos Beverages:  water     NUTRITION INTERVENTION  Nutrition education (E-1) on the following topics:   Initial Follow-up  [x]  []  Definition of Type 1 and Type 2 Diabetes and implications for pregnancy [x]  []  Why dietary management is important  in controlling blood glucose [x]  []  Effects each nutrient has on blood glucose levels [x]  []  Simple carbohydrates vs complex carbohydrates [x]  []  Fluid intake [x]  []  Creating a balanced meal plan []  []  Carbohydrate counting  [x]  []  When to check blood glucose levels [x]  []  Proper blood glucose monitoring techniques []  []  Effect of stress and stress reduction techniques  []  []  Exercise effect on blood glucose levels, appropriate exercise during pregnancy []  []  Importance of limiting caffeine and abstaining from alcohol and smoking []  []  Medications used for blood sugar control during pregnancy []  []  Hypoglycemia and rule of 15 []  []  Postpartum self care  CGM:  Dexcom G7 - current sensor reading 110 and post meal 150-205  CGM Results from download: 11/13/2023  % Time CGM active:    %   (Goal >70%)  Average glucose:   115 mg/dL for 14 days  Glucose management indicator:   6.1 %  Time in range (70-180 mg/dL):   93 %   (Goal >29%)  Time High (181-250 mg/dL):   4 %   (Goal < 74%)  Time Very High (>250 mg/dL):    0 %   (Goal < 5%)  Time Low (54-69 mg/dL):   2 %   (Goal <5%)  Time Very Low (<54 mg/dL):   <1 %   (Goal <8%)  %CV (glucose variability)     %  (Goal <36%)    Patient  instructed to monitor glucose levels: FBS: 60 - <= 95 mg/dL; 2 hour: <= 879 mg/dL  Patient received handouts: Pre-existing Diabetes and Pregnancy Carbohydrate Counting List Blood glucose log Snack ideas for diabetes during pregnancy How to Thrive:  A Guide for Your Journey with Diabetes by the ADA - will mail to patient  Patient will be seen for follow-up as needed.

## 2023-11-14 ENCOUNTER — Ambulatory Visit (INDEPENDENT_AMBULATORY_CARE_PROVIDER_SITE_OTHER): Admitting: Obstetrics & Gynecology

## 2023-11-14 ENCOUNTER — Encounter: Payer: Self-pay | Admitting: Obstetrics & Gynecology

## 2023-11-14 VITALS — BP 116/75 | HR 86 | Wt 197.0 lb

## 2023-11-14 DIAGNOSIS — O24112 Pre-existing diabetes mellitus, type 2, in pregnancy, second trimester: Secondary | ICD-10-CM | POA: Diagnosis not present

## 2023-11-14 DIAGNOSIS — Z3A14 14 weeks gestation of pregnancy: Secondary | ICD-10-CM | POA: Diagnosis not present

## 2023-11-14 DIAGNOSIS — O0990 Supervision of high risk pregnancy, unspecified, unspecified trimester: Secondary | ICD-10-CM | POA: Diagnosis not present

## 2023-11-14 MED ORDER — INSULIN GLARGINE 100 UNIT/ML SOLOSTAR PEN: 10.0000 [IU] | PEN_INJECTOR | Freq: Every morning | SUBCUTANEOUS | 1 refills | Status: AC

## 2023-11-14 NOTE — Progress Notes (Signed)
   PRENATAL VISIT NOTE  Subjective:  Christine [name] is a 23 y.o. G1P0000 at [redacted]w[redacted]d being seen today for ongoing prenatal care.  She is currently monitored for the following issues for this high-risk pregnancy and has Diabetes mellitus (HCC); Asthma; Rapid weight loss; Temporomandibular joint (TMJ) pain; Hydradenitis; Supervision of high risk pregnancy, antepartum; and Pre-existing type 2 diabetes mellitus during pregnancy on their problem list.  Patient reports no complaints.  Contractions: Not present. Vag. Bleeding: None.   . Denies leaking of fluid.   The following portions of the patient's history were reviewed and updated as appropriate: allergies, current medications, past family history, past medical history, past social history, past surgical history and problem list.   Objective:    Vitals:   11/14/23 1314  BP: 116/75  Pulse: 86  Weight: 197 lb (89.4 kg)    Fetal Status:  Fetal Heart Rate (bpm): 159        General: Alert, oriented and cooperative. Patient is in no acute distress.  Skin: Skin is warm and dry. No rash noted.   Cardiovascular: Normal heart rate noted  Respiratory: Normal respiratory effort, no problems with respiration noted  Abdomen: Soft, gravid, appropriate for gestational age.  Pain/Pressure: Absent     Pelvic: Cervical exam deferred        Extremities: Normal range of motion.  Edema: None  Mental Status: Normal mood and affect. Normal behavior. Normal judgment and thought content.   Assessment and Plan:  Pregnancy: G1P0000 at [redacted]w[redacted]d 1. Pre-existing type 2 diabetes mellitus during pregnancy in second trimester Reviewed blood sugars, she has significant number of fasting levels >95, and also elevated postprandials.  She is on the Dexcom, target range 70-180, and she is in goal about 92% of the time.  Will increase Lantus  from 8 to 10 units qam. She will return in 2 weeks for reevaluation. Appreciate the input from Leita Constable, RD/DM coordinator.  Anatomy scan  scheduled, will also be scheduled for fetal ECHO - Ambulatory referral to Pediatric Cardiology - insulin  glargine (LANTUS ) 100 UNIT/ML Solostar Pen; Inject 10 Units into the skin in the morning.  Dispense: 3 mL; Refill: 1  2. [redacted] weeks gestation of pregnancy 3. Supervision of high risk pregnancy, antepartum (Primary) No other concerns.  Patient was not happy that she was not seeing Dr. Abigail today, I discussed the nature of our practice and having multiple providers.  I also advised her to ask at the front desk if she could be scheduled with Dr. Abigail, if she is available; but emphasized that she will likely be seen by other providers. Also discussed that this is the case at [facility] and for any evaluation at [facility]. Routine obstetric precautions reviewed.  Please refer to After Visit Summary for other counseling recommendations.   Return in about 2 weeks (around 11/28/2023) for OFFICE OB VISIT (MD only) - Prefers to see Dr. Abigail if possible.  Future Appointments  Date Time Provider Department Center  11/29/2023  2:15 PM [facility]  12/19/2023  2:15 PM [facility] PROVIDER 1 [facility]  12/19/2023  2:30 PM [facility] US4 [facility]  03/10/2024 12:30 PM [facility] US  1 [facility]    Gloris Hugger, MD

## 2023-11-14 NOTE — Progress Notes (Signed)
 ROB, BS readings are on her phone and are elevated.

## 2023-11-20 NOTE — Telephone Encounter (Signed)
 Called patient to follow-up regarding if she has picked up the insulin  pump as well as blood glucose readings.  Patient was not available and her mailbox was full.  Leita Constable, RD, LDN, CDCES, DipACLM

## 2023-11-21 ENCOUNTER — Other Ambulatory Visit: Payer: Self-pay

## 2023-11-21 DIAGNOSIS — O24112 Pre-existing diabetes mellitus, type 2, in pregnancy, second trimester: Secondary | ICD-10-CM

## 2023-11-22 NOTE — Progress Notes (Deleted)
 Patient was seen for Pre-existing Diabetes During Pregnancy) on ***  Start time *** and End time ***   Estimated due date: 05/09/2024; ***w***d  Interpreter from ***, Name:  ***, #***  Clinical: Medications: *** Medical History: *** Labs: OGTT fasting ***, 1 hour ***, 2 hour *** on ***, Lab Results  Component Value Date   HGBA1C 13.8 (H) 08/27/2023    Dietary and Lifestyle History: ***  Physical Activity: *** Stress: *** Sleep: ***  24 hr Recall:  First Meal:  *** Snack:  *** Second meal:  *** Snack:  *** Third meal:  *** Snack:  *** Beverages:  ***  NUTRITION INTERVENTION  Nutrition education (E-1) on the following topics:   Initial Follow-up  []  []  Definition of Gestational Diabetes []  []  Why dietary management is important in controlling blood glucose []  []  Effects each nutrient has on blood glucose levels []  []  Simple carbohydrates vs complex carbohydrates []  []  Fluid intake []  []  Creating a balanced meal plan []  []  Carbohydrate counting  []  []  When to check blood glucose levels []  []  Proper blood glucose monitoring techniques []  []  Effect of stress and stress reduction techniques  []  []  Exercise effect on blood glucose levels, appropriate exercise during pregnancy []  []  Importance of limiting caffeine and abstaining from alcohol and smoking []  []  Medications used for blood sugar control during pregnancy []  []  Hypoglycemia and rule of 15 []  []  Postpartum self care  Blood glucose monitor given: *** Lot # *** Exp: *** CBG: *** mg/dL  *** Patient has a meter prior to visit. Patient is *** testing pre breakfast and 2 hours after each meal. FBS: *** Postprandial: ***  Patient instructed to monitor glucose levels: FBS: 60 - <= 95 mg/dL; 2 hour: <= 879 mg/dL  Patient received handouts: Nutrition Diabetes and Pregnancy Carbohydrate Counting List Blood glucose log Snack ideas for diabetes during pregnancy  Patient will be seen for follow-up as needed.

## 2023-11-27 ENCOUNTER — Telehealth: Payer: Self-pay | Admitting: Dietician

## 2023-11-27 ENCOUNTER — Ambulatory Visit (INDEPENDENT_AMBULATORY_CARE_PROVIDER_SITE_OTHER): Admitting: Obstetrics & Gynecology

## 2023-11-27 VITALS — BP 122/81 | HR 86 | Wt 198.0 lb

## 2023-11-27 DIAGNOSIS — E11628 Type 2 diabetes mellitus with other skin complications: Secondary | ICD-10-CM

## 2023-11-27 DIAGNOSIS — O0990 Supervision of high risk pregnancy, unspecified, unspecified trimester: Secondary | ICD-10-CM

## 2023-11-27 DIAGNOSIS — N3000 Acute cystitis without hematuria: Secondary | ICD-10-CM

## 2023-11-27 DIAGNOSIS — Z3A16 16 weeks gestation of pregnancy: Secondary | ICD-10-CM

## 2023-11-27 DIAGNOSIS — O24112 Pre-existing diabetes mellitus, type 2, in pregnancy, second trimester: Secondary | ICD-10-CM

## 2023-11-27 MED ORDER — OMNIPOD 5 DEXG7G6 INTRO GEN 5 KIT: PACK | 0 refills | Status: DC

## 2023-11-27 MED ORDER — CEFADROXIL 500 MG PO CAPS: 500.0000 mg | ORAL_CAPSULE | Freq: Two times a day (BID) | ORAL | 0 refills | Status: DC

## 2023-11-27 MED ORDER — BLOOD PRESSURE KIT DEVI: 1.0000 | 0 refills | Status: AC

## 2023-11-27 MED ORDER — OMNIPOD 5 DEXG7G6 INTRO GEN 5 KIT: PACK | 0 refills | Status: AC

## 2023-11-27 NOTE — Telephone Encounter (Signed)
 Received email from Omnipod rep.   Called pharmacy again and had them run for 365 days.  It went through with $0 to patient. Called patient to inform her.

## 2023-11-27 NOTE — Progress Notes (Signed)
 Pt states she is changing to the insulin  pump.  Pt never took antibiotic for UTI.

## 2023-11-27 NOTE — Telephone Encounter (Signed)
 She has the PODS and insulin  for the pump but pump order didn't go through before.

## 2023-11-27 NOTE — Telephone Encounter (Signed)
 Called patient.  She has the PODs and insulin  in a vial for use in pump but does not have the Omnipod 5 kit yet.  Sent new message to MD to cosign the order. Patient has my number to call for training when she gets the kit.  Leita Constable, RD, LDN, CDCES, DipACLM

## 2023-11-27 NOTE — Telephone Encounter (Signed)
 Pump orders obtained today from MD. [facility] pharmacy and the Omnipod 5 pump kit requires prior authorization.  [facility]   Pharmacist stated they can get a kit from another pharmacy if needed.   Will schedule patient's pump training appointment for 11/29/23 but will need to reschedule if she cannot get the pump. Will cancel her appointment with the other RD 9/25 as she will follow with me for the pump. Instructed patient not to take the lanuts on the morning of pump training. Instructed her to bring the pump kit, pods, insulin  in a vial to the appointment. Will message her to get the Omnipod account set up if possible prior to the appointment.  Leita Constable, RD, LDN, CDCES, DipACLM

## 2023-11-27 NOTE — Progress Notes (Signed)
   PRENATAL VISIT NOTE  Subjective:  Christine [name] is a 23 y.o. G1P0000 at [redacted]w[redacted]d being seen today for ongoing prenatal care.  She is currently monitored for the following issues for this high-risk pregnancy and has Diabetes mellitus (HCC); Asthma; Rapid weight loss; Temporomandibular joint (TMJ) pain; Hydradenitis; Supervision of high risk pregnancy, antepartum; and Pre-existing type 2 diabetes mellitus during pregnancy on their problem list.  Patient reports no complaints.  Contractions: Not present. Vag. Bleeding: None.   . Denies leaking of fluid.   The following portions of the patient's history were reviewed and updated as appropriate: allergies, current medications, past family history, past medical history, past social history, past surgical history and problem list.   Objective:    Vitals:   11/27/23 1448  BP: 122/81  Pulse: 86  Weight: 198 lb (89.8 kg)    Fetal Status:  Fetal Heart Rate (bpm): 150        General: Alert, oriented and cooperative. Patient is in no acute distress.  Skin: Skin is warm and dry. No rash noted.   Cardiovascular: Normal heart rate noted  Respiratory: Normal respiratory effort, no problems with respiration noted  Abdomen: Soft, gravid, appropriate for gestational age.  Pain/Pressure: Absent     Pelvic: Cervical exam deferred        Extremities: Normal range of motion.     Mental Status: Normal mood and affect. Normal behavior. Normal judgment and thought content.   Assessment and Plan:  Pregnancy: G1P0000 at [redacted]w[redacted]d 1. Supervision of high risk pregnancy, antepartum (Primary) screening - AFP, Serum, Open Spina Bifida - Blood Pressure Monitoring (BLOOD PRESSURE KIT) DEVI; 1 Device by Does not apply route once a week.  Dispense: 1 each; Refill: 0  2. [redacted] weeks gestation of pregnancy F/u anatomy scan in 3 weeks - AFP, Serum, Open Spina Bifida  3. Acute cystitis without hematuria  - cefadroxil  (DURICEF) 500 MG capsule; Take 1 capsule (500 mg  total) by mouth 2 (two) times daily.  Dispense: 14 capsule; Refill: 0  4. Type 2 diabetes mellitus with other skin complication, without long-term current use of insulin  (HCC) Switching to Dexcom, FBS and PP 50% out of range  5. Pre-existing type 2 diabetes mellitus during pregnancy in second trimester Needs fetal echo in 6 weeks  Preterm labor symptoms and general obstetric precautions including but not limited to vaginal bleeding, contractions, leaking of fluid and fetal movement were reviewed in detail with the patient. Please refer to After Visit Summary for other counseling recommendations.   Return in about 4 weeks (around 12/25/2023).  Future Appointments  Date Time Provider Department Center  11/29/2023  8:00 AM [place] Leita CROME, RD [facility]  12/19/2023  2:15 PM [facility] PROVIDER 1 [facility]  12/19/2023  2:30 PM [facility] US4 [facility]  03/10/2024 12:30 PM [facility] US  1 [facility]    Lynwood Solomons, MD

## 2023-11-29 ENCOUNTER — Other Ambulatory Visit

## 2023-11-29 ENCOUNTER — Encounter: Admitting: Dietician

## 2023-11-29 DIAGNOSIS — O24112 Pre-existing diabetes mellitus, type 2, in pregnancy, second trimester: Secondary | ICD-10-CM

## 2023-11-29 DIAGNOSIS — E119 Type 2 diabetes mellitus without complications: Secondary | ICD-10-CM

## 2023-11-29 NOTE — Progress Notes (Signed)
 Patient was here today with her mother for insulin  pump training on the Omnipod 5 pump and linking with the Dexcom G7 sensor.  Start time:  1305   End time:  1410  Patient is here today with her mother for training on the Omnipod 5 Insulin  Pump. 16 weeks 6 days gestation She is now in [place].  It was confirmed that the patient was aware of the following: Blood glucose (BG) testing Treating hypoglycemia Hyperglycemia Carbohydrate counting   (Patient will be using carbohydrate counting with use of this pump.) Sick day management  Patient was instructed on the following: Have the following supplies available to be prepared: Omnipod PDM and pods Insulin  and syringe Blood glucose meter, strips, lancets, lancing device Glucose tablets/fast acting source of carbohydrate/Glucagon   Supply Reorder [facility] Customer Care [phone] ext [phone] Reorder - info/contact number When to reorder (when last box of pods is opened)  System Overview Communication process/distance Storage guidelines  Pod: Fill port/adhesive/needle cap/pink slide insert/Waterproof  PDM Battery/button layout/wireless updates Research scientist (physical sciences) updates)  Connecting the Sensor:  PDM Settings Pump Therapy Order Form with settings below Basic settings - Personalized lock screen/time/time zone/date/date format Basal settings Max basal 1.85 Basal rates 0.9 Bolus settings Target Blood glucose  110 (lowest factory setting) Insulin  to carb (IC) ratio  9 Correction factor 38 mg/dL/unit for BG above 889 mg/dL Minimum blood glucose for bolus calculations 70 mg/dL Reverse correction ON Duration of insulin  action  3 Max bolus 30 units Patient was able to accurately enter pump settings into PDM.  Pod Activation Change Pod  Room temperature insulin  Fill syringe - min/max amounts DO NOT prefill Pod Site selection/rotation & prep Automated cannula insertion - check infusion site/viewing window & pink slide insert Blood  glucose reminder 1.5 hours after insertion When to change POD Patient was able to place pod and view insert.  Home Screen Status Bar, Menu Icon, Notification/Alarms Tabs Dashboard - IOB (if Calculator ON) - Instructed to leave calculator on Basal (Temp Basal if Temp Basal running) Pod Info - View Pod detail Last Bolus, Last BG Bolus button  Menu icon PDM function - Temp Basal, Pod, Enter BG, Suspend Manage Programs & Presets - Basal programs, temp basal presets, bolus presets Food Library ([employer] app, other tools for carbohydrate counting) History - Notifications & Alarms, Insulin  & BG History Settings - PDM Device, Pod sites, Reminders, Blood Glucose, Basal & Temp Basal, Bolus About  Advanced Features (to be discussed at a further date based on patient needs) Extended bolus Temp basal rate Additional basal programs Presets - Temp basal/Bolus presets  Troubleshooting Hypoglycemia Sick day management resource Hyperglycemia & Ketones  Notifications & Alarms Custom reminders Pod Expiration alert Low Reservoir alert  Advisory & Hazard Alarms Advisory alarms - intermittent tones - response required Hazard alarm - Continuous vibration, and Tone - urgent attention required - Pod Expired, Empty Reservoir, Occlusion, Pod Error, Auto Off, PDM Error, System Error  Ongoing Success [place] invitation provided Omnipod app(s) Register for [employer] 24/7 Customer Care  [phone] Reviewed User Guide Showed patient Customer's Bill of Rights and Responsibilities and instructed them to review.  Handouts Provided: Omnipod 5 POD site preparation and placement  Patient to contact me via MyChart or phone.

## 2023-11-30 ENCOUNTER — Telehealth: Payer: Self-pay | Admitting: Dietician

## 2023-11-30 NOTE — Telephone Encounter (Signed)
 Called patient last night as she was started on the Omnipod 5 yesterday at about 1:00.  No concerns or questions.  Called patient today after reviewing her Dexcom Clarity report.  No concerns or questions.  No need for setting changes.     Leita Constable, RD, LDN, CDCES, DipACLM

## 2023-12-01 LAB — AFP, SERUM, OPEN SPINA BIFIDA
AFP MoM: 1.76
AFP Value: 45.5 ng/mL
Gest. Age on Collection Date: 16.4 wk
Maternal Age At EDD: 21.8 a
OSBR Risk 1 IN: 840
Test Results:: NEGATIVE
Weight: 198 [lb_av]

## 2023-12-03 ENCOUNTER — Ambulatory Visit

## 2023-12-04 ENCOUNTER — Telehealth: Payer: Self-pay

## 2023-12-04 NOTE — Telephone Encounter (Signed)
 Yesterday, I attempted to call patient to remind her of virtual GC appointment. Left voicemail with callback number. She did not join the virtual appointment.

## 2023-12-06 ENCOUNTER — Telehealth: Payer: Self-pay | Admitting: Dietician

## 2023-12-06 DIAGNOSIS — D573 Sickle-cell trait: Secondary | ICD-10-CM | POA: Insufficient documentation

## 2023-12-06 DIAGNOSIS — D563 Thalassemia minor: Secondary | ICD-10-CM | POA: Insufficient documentation

## 2023-12-06 NOTE — Telephone Encounter (Signed)
 Called patient today to follow up regarding her Omnipod 5. CGM Clarity reviewed: Fasting 90-110 Post meal:  110-140 with occasional 160. She is having daily readings to 60 with occasional readings below 60.  Patient was not available. Left a message asking patient to return my call and instructed patient to accurately count carbohydrates.  Christine [name], RD, LDN, CDCES, DipACLM

## 2023-12-10 ENCOUNTER — Telehealth: Payer: Self-pay | Admitting: Dietician

## 2023-12-10 NOTE — Telephone Encounter (Signed)
 Text received from patient that she will be out of Novolog  soon.   Reviewed chart and she has more refills.  Text sent to patient which stated if her total daily dose is higher than prescribed we may need to change the total daily dose on the prescription.  She has more novolog  prescriptions at her pharmacy currently.  Patient is to text or call if she has further questions.  Leita Constable, RD, LDN, CDCES, DipACLM

## 2023-12-11 ENCOUNTER — Other Ambulatory Visit: Payer: Self-pay | Admitting: Lactation Services

## 2023-12-11 MED ORDER — INSULIN ASPART 100 UNIT/ML IJ SOLN: INTRAMUSCULAR | 11 refills | Status: AC

## 2023-12-11 NOTE — Progress Notes (Signed)
 Order for Insulin  reentered due to inadequate volume for a months supply. Reviewed with Leita Constable, RD

## 2023-12-12 ENCOUNTER — Encounter: Admitting: Physician Assistant

## 2023-12-19 ENCOUNTER — Ambulatory Visit: Admitting: Obstetrics and Gynecology

## 2023-12-19 ENCOUNTER — Ambulatory Visit: Attending: Obstetrics and Gynecology

## 2023-12-19 VITALS — BP 137/76

## 2023-12-19 DIAGNOSIS — E119 Type 2 diabetes mellitus without complications: Secondary | ICD-10-CM

## 2023-12-19 DIAGNOSIS — Z3A19 19 weeks gestation of pregnancy: Secondary | ICD-10-CM | POA: Insufficient documentation

## 2023-12-19 DIAGNOSIS — D563 Thalassemia minor: Secondary | ICD-10-CM

## 2023-12-19 DIAGNOSIS — O99019 Anemia complicating pregnancy, unspecified trimester: Secondary | ICD-10-CM | POA: Insufficient documentation

## 2023-12-19 DIAGNOSIS — O0990 Supervision of high risk pregnancy, unspecified, unspecified trimester: Secondary | ICD-10-CM | POA: Insufficient documentation

## 2023-12-19 DIAGNOSIS — O99012 Anemia complicating pregnancy, second trimester: Secondary | ICD-10-CM

## 2023-12-19 DIAGNOSIS — O24112 Pre-existing diabetes mellitus, type 2, in pregnancy, second trimester: Secondary | ICD-10-CM

## 2023-12-19 DIAGNOSIS — D573 Sickle-cell trait: Secondary | ICD-10-CM | POA: Diagnosis not present

## 2023-12-19 DIAGNOSIS — Z3A11 11 weeks gestation of pregnancy: Secondary | ICD-10-CM | POA: Diagnosis not present

## 2023-12-19 NOTE — Progress Notes (Signed)
 Maternal-Fetal Medicine Consultation  Name: Christine [name]  MRN: [id number]  GA: [id number] [redacted]w[redacted]d   Patient is here for fetal anatomy scan.  She was accompanied by her partner.  On cell-free fetal DNA screening, the risks of aneuploidies are not increased. MSAFP screening showed low risk for open-neural tube defects. Her pregnancy is dated by early ultrasound and we have amended her EDD to 03/0/2026 (see below). Her problems include: -Type 2 diabetes.  She is on insulin  pump.  She reports her fasting levels range between 90 and 95 mg/dL and postprandial levels between 120 and 140 mg/dL.  She had seen our diabetic educator.  Recent hemoglobin A1c was 13.8%. - Patient has sickle cell trait and is a silent carrier for alpha thalassemia.  Patient had missed her virtual genetic counseling appointment.  Ultrasound Fetal biometry is consistent with her previously-established dates. Normal amniotic fluid.  No makers of aneuploidies or fetal structural defects are seen.  Patient understands the limitations of ultrasound in detecting fetal anomalies.   Diabetes in pregnancy -I counseled the patient that very high hemoglobin A1c of 13.8% is associated with increased fetal congenital malformations up to 15%.  Congenital malformations include skeletal, central nervous system and cardiac anomalies.  I recommended fetal echocardiography.  Patient has not received a call from pediatric cardiologist for fetal echocardiography appointment. - I discussed normal blood glucose parameters and the importance of good control. -Poorly controlled diabetes can lead to fetal macrosomia, shoulder dystocia and neurological injuries, stillbirth and neonatal complications including respiratory distress syndrome, hypoglycemia and prolonged NICU admission. -I discussed ultrasound protocol of serial fetal growth assessments and weekly antenatal testing from [redacted] weeks gestation until delivery. -If diabetes is not well-controlled,  delivery at 37 weeks or earlier should be considered.  And well-controlled diabetes, patient can be delivered at [redacted] weeks gestation.  Sickle cell trait I discussed the genetics and that if her partner has sickle cell trait, there is a 1 in 4 chance of the fetus being affected with sickle cell disease.  I strongly recommended that he be screened for sickle cell trait and that his blood can be drawn at our office today.  I I counseled the patient that prenatal diagnosis is possible by amniocentesis or UNITY screening (maternal blood). Her partner decided to come later for screening.  She is also a silent carrier for alpha thalassemia, and I recommended genetic counseling and partner screening.  Recommendations -An appointment was made for her to return in 4 weeks for fetal growth assessment. - We have requested an appointment for fetal echocardiography ([facility]). - Fetal growth assessments every 4 weeks till delivery. - Weekly antenatal testing from [redacted] weeks gestation until delivery.     Consultation including face-to-face (more than 50%) counseling 45 minutes.

## 2023-12-20 ENCOUNTER — Other Ambulatory Visit: Payer: Self-pay | Admitting: *Deleted

## 2023-12-20 DIAGNOSIS — O99019 Anemia complicating pregnancy, unspecified trimester: Secondary | ICD-10-CM

## 2023-12-20 DIAGNOSIS — O24112 Pre-existing diabetes mellitus, type 2, in pregnancy, second trimester: Secondary | ICD-10-CM

## 2023-12-20 DIAGNOSIS — O26842 Uterine size-date discrepancy, second trimester: Secondary | ICD-10-CM

## 2023-12-24 ENCOUNTER — Encounter: Admitting: Obstetrics

## 2024-01-08 ENCOUNTER — Telehealth: Payer: Self-pay | Admitting: Dietician

## 2024-01-08 NOTE — Telephone Encounter (Signed)
 Called patient to follow up regarding her insulin  pump and settings and to discuss recent lows.  Patient was not available.  Asked that she call me at [phone].

## 2024-01-15 ENCOUNTER — Telehealth: Payer: Self-pay | Admitting: Dietician

## 2024-01-15 NOTE — Telephone Encounter (Signed)
 Called patient who was not available. Left a message for her to make an appointment (work her in my schedule this week).  [phone] to evaluate her insulin  pump settings as well as nutrition education.  Dexcom report below.

## 2024-01-16 ENCOUNTER — Ambulatory Visit: Attending: Obstetrics and Gynecology | Admitting: Obstetrics and Gynecology

## 2024-01-16 ENCOUNTER — Other Ambulatory Visit: Payer: Self-pay | Admitting: *Deleted

## 2024-01-16 ENCOUNTER — Ambulatory Visit

## 2024-01-16 VITALS — BP 130/65 | HR 93

## 2024-01-16 DIAGNOSIS — O99012 Anemia complicating pregnancy, second trimester: Secondary | ICD-10-CM

## 2024-01-16 DIAGNOSIS — Z148 Genetic carrier of other disease: Secondary | ICD-10-CM | POA: Insufficient documentation

## 2024-01-16 DIAGNOSIS — O99512 Diseases of the respiratory system complicating pregnancy, second trimester: Secondary | ICD-10-CM

## 2024-01-16 DIAGNOSIS — Z862 Personal history of diseases of the blood and blood-forming organs and certain disorders involving the immune mechanism: Secondary | ICD-10-CM | POA: Diagnosis not present

## 2024-01-16 DIAGNOSIS — O24113 Pre-existing diabetes mellitus, type 2, in pregnancy, third trimester: Secondary | ICD-10-CM | POA: Diagnosis not present

## 2024-01-16 DIAGNOSIS — Z794 Long term (current) use of insulin: Secondary | ICD-10-CM

## 2024-01-16 DIAGNOSIS — J45909 Unspecified asthma, uncomplicated: Secondary | ICD-10-CM

## 2024-01-16 DIAGNOSIS — Z3A24 24 weeks gestation of pregnancy: Secondary | ICD-10-CM | POA: Insufficient documentation

## 2024-01-16 DIAGNOSIS — E11628 Type 2 diabetes mellitus with other skin complications: Secondary | ICD-10-CM

## 2024-01-16 DIAGNOSIS — O0990 Supervision of high risk pregnancy, unspecified, unspecified trimester: Secondary | ICD-10-CM

## 2024-01-16 DIAGNOSIS — D573 Sickle-cell trait: Secondary | ICD-10-CM

## 2024-01-16 DIAGNOSIS — Z362 Encounter for other antenatal screening follow-up: Secondary | ICD-10-CM | POA: Insufficient documentation

## 2024-01-16 DIAGNOSIS — O358XX0 Maternal care for other (suspected) fetal abnormality and damage, not applicable or unspecified: Secondary | ICD-10-CM

## 2024-01-16 DIAGNOSIS — O24112 Pre-existing diabetes mellitus, type 2, in pregnancy, second trimester: Secondary | ICD-10-CM | POA: Diagnosis not present

## 2024-01-16 DIAGNOSIS — O99810 Abnormal glucose complicating pregnancy: Secondary | ICD-10-CM | POA: Diagnosis not present

## 2024-01-16 DIAGNOSIS — E119 Type 2 diabetes mellitus without complications: Secondary | ICD-10-CM | POA: Diagnosis not present

## 2024-01-16 DIAGNOSIS — O26842 Uterine size-date discrepancy, second trimester: Secondary | ICD-10-CM

## 2024-01-16 DIAGNOSIS — D563 Thalassemia minor: Secondary | ICD-10-CM

## 2024-01-16 NOTE — Progress Notes (Signed)
 [facility] at [facility] [address] Phone:  [phone]   Fax:  [phone]      In-Person Genetic Counseling Clinic Note:   I spoke with 23 y.o. Christine [name] today to discuss her carrier screening results. She was referred by Abigail Rollo DASEN, MD.   Pregnancy History:    [id number]. EGA: [redacted]w[redacted]d by US . EDD: 05/07/2024. Christine [name] had one SAB. Reports T2DM. Denies other major personal health concerns. Denies bleeding, infections, and fevers in this pregnancy. Denies using tobacco, alcohol, or street drugs in this pregnancy.   Family History:    A three-generation pedigree was created and scanned into Epic under the Media tab.  FOB is adopted and has limited family history.  Patient ethnicity reported as Black and FOB ethnicity reported as Black. Denies Ashkenazi Jewish ancestry.  Family history not remarkable for consanguinity, individuals with birth defects, intellectual disability, autism spectrum disorder, multiple spontaneous abortions, still births, or unexplained neonatal death.   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 She was not found to be a carrier for cystic fibrosis or spinal muscular atrophy which significantly reduces but does not eliminate the chance of being a carrier.  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 Therefore, she makes both the typical hemoglobin A and the atypical hemoglobin S (HbA/S). This is typically not associated with symptoms in the carrier.  We reviewed beta globin genes, hemoglobin, forms of beta-hemoglobinopathies and their natural histories, and the autosomal mode of inheritance. Christine [name] will either pass down either the normal beta globin  gene that produces the normal hemoglobin A or the altered beta globin gene that produces hemoglobin S. There would be a 25% risk for the pregnancy to be affected with sickle cell disease (HbS/S) if Christine [name]'s reproductive partner is also a carrier. There are several types of beta-hemoglobinopathies, including hemoglobin C, hemoglobin O, hemoglobin E, and beta-thalassemia. If Christine [name]'s reproductive partner is found to be a carrier for for a beta-hemoglobinopathy, there would be a 25% chance that this pregnancy would be affected. The type of beta-hemoglobinopathy and symptoms will vary depending on the genotype.   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  We reviewed the genetics of alpha thalassemia, autosomal recessive mode of inheritance, and clinical features of these conditions. We reviewed that Christine [name] will either pass down two copies of the alpha globin gene (??) OR one copy of the alpha globin gene (-?) in each pregnancy. Therefore, this pregnancy is not at increased risk for hemoglobin Bart's due to four deletions of the alpha globin genes (--/--) regardless of her reproductive partner's carrier status. The pregnancy will be at increased risk (25%) for hemoglobin H disease if her partner is an alpha thalassemia carrier in the cis configuration (--/??). We reviewed that this is more common in Southeast Asian populations and less common in those with Black ancestry.  FOB carrier screening offered: Given these results, we discussed and offered carrier screening for Christine [name]'s reproductive partner Christine [name]. We reviewed the benefits and limitations of carrier screening and that it can detect most but not all carriers.  She stated he  is available for testing and was planning to come today for a blood draw but was not able due to work. She stated he will likely join her for her next ultrasound and will  have his blood drawn then. We offered he come to our clinic before then for a blood draw. We also offered her to take home a saliva kit. She stated she will discuss with FOB and inform us  of their decisions.   We reviewed that if both were found to be carriers, prenatal diagnosis through amniocentesis would be available. We reviewed the technical aspects, benefits, risks, and limitations of amniocentesis including the 1 in 500 risk for preterm delivery. Alternatively, testing can be completed postnatally. Of note, alpha thalassemia is not included on Christine [name] 's Newborn Screening (NBS) program.  In the meantime, we calculated the risk for the current pregnancy to be affected with a beta-hemoglobinopathy. Based on Sabine's carrier screening results and her partner's ethnicity, the current pregnancy is at a 1 in 40 (~2.5%) risk to be affected.   Newborn Screening. The Christine [name]  Newborn Screening (NBS) program will screen all newborn babies for cystic fibrosis, spinal muscular atrophy, hemoglobinopathies, and numerous other conditions.  Previous Testing Completed:  Low risk NIPS: Christine [name] previously completed Panorama noninvasive prenatal screening (NIPS) in this pregnancy. The result is low risk, consistent with a female fetus. This screening significantly reduces but does not eliminate the chance that the current pregnancy has Down syndrome (trisomy 24), trisomy 47, trisomy 68, common sex chromosome conditions, and 22q11.2 microdeletion syndrome. Please see report for details. There are many genetic conditions that cannot be detected by NIPS.   Negative ms-AFP screening: Christine [name] previously completed a maternal serum AFP screen in this pregnancy. The result is screen negative. Please see report for details. A negative result reduces the risk that the current pregnancy has an open neural tube defect. Closed neural tube defects and some open defects may not be detected by this screen.   Plan of  Care:   Patient to discuss with FOB regarding carrier screening. They currently plan for him to join her during her next ultrasound visit for his blood draw for beta-hemoglobinopathies and alpha thalassemia. They will inform us  if he wishes to have carrier screening earlier. Newborn Screening. Referral to pediatric hematology as clinically indicated. Declined amniocentesis. Follow-up [facility] ultrasound on 02/14/2024.   Informed consent was obtained. All questions were answered.   55 minutes were spent on the date of the encounter in service to the patient including preparation, face-to-face consultation, discussion of test reports and available next steps, pedigree construction, genetic risk assessment, documentation, and care coordination.    Thank you for sharing in the care of Christine [name] with us .  Please do not hesitate to contact us  at [phone] if you have any questions.   Lauraine Bodily, MS, [facility] Certified Genetic Counselor   Genetic counseling student involved in appointment: Yes Deanne SAUNDERS., [employer]).

## 2024-01-16 NOTE — Progress Notes (Signed)
 Maternal-Fetal Medicine Consultation  Name: Christine [name]  MRN: [id number]  GA: [id number] [redacted]w[redacted]d   Patient is here for fetal growth assessment.  Ultrasound Normal fetal growth and amniotic fluid.  Cardiac anatomy appears normal.  Type 2 diabetes Patient is on insulin  pump and Dexcom.  She has missed her appointment with diabetic educator.  She also missed her prenatal visit appointment.  I emphasized the importance of good blood glucose control to prevent adverse outcomes including stillbirth. Patient had missed her fetal echocardiography appointment and will be rescheduling. We sent a message to Fermina to schedule her appointment.  Sickle cell trait Patient met with our genetic counselor today and will get her partner screened.  Recommendations -An appointment was made for her to return in 4 weeks for fetal growth assessment. - Weekly antenatal testing from [redacted] weeks gestation until delivery.     Consultation including face-to-face (more than 50%) counseling 20 minutes.

## 2024-01-21 DIAGNOSIS — Z6833 Body mass index (BMI) 33.0-33.9, adult: Secondary | ICD-10-CM | POA: Diagnosis not present

## 2024-01-21 DIAGNOSIS — Z8619 Personal history of other infectious and parasitic diseases: Secondary | ICD-10-CM | POA: Diagnosis not present

## 2024-01-21 DIAGNOSIS — Z3A24 24 weeks gestation of pregnancy: Secondary | ICD-10-CM | POA: Diagnosis not present

## 2024-01-21 DIAGNOSIS — Z794 Long term (current) use of insulin: Secondary | ICD-10-CM | POA: Diagnosis not present

## 2024-01-21 DIAGNOSIS — N760 Acute vaginitis: Secondary | ICD-10-CM | POA: Diagnosis not present

## 2024-01-21 DIAGNOSIS — E1165 Type 2 diabetes mellitus with hyperglycemia: Secondary | ICD-10-CM | POA: Diagnosis not present

## 2024-01-21 DIAGNOSIS — Z79899 Other long term (current) drug therapy: Secondary | ICD-10-CM | POA: Diagnosis not present

## 2024-01-23 ENCOUNTER — Ambulatory Visit: Payer: Self-pay

## 2024-01-24 ENCOUNTER — Ambulatory Visit (INDEPENDENT_AMBULATORY_CARE_PROVIDER_SITE_OTHER): Admitting: Obstetrics and Gynecology

## 2024-01-24 ENCOUNTER — Encounter: Payer: Self-pay | Admitting: Obstetrics and Gynecology

## 2024-01-24 VITALS — BP 119/74 | HR 96 | Wt 212.6 lb

## 2024-01-24 DIAGNOSIS — D573 Sickle-cell trait: Secondary | ICD-10-CM | POA: Diagnosis not present

## 2024-01-24 DIAGNOSIS — O24112 Pre-existing diabetes mellitus, type 2, in pregnancy, second trimester: Secondary | ICD-10-CM

## 2024-01-24 DIAGNOSIS — O0990 Supervision of high risk pregnancy, unspecified, unspecified trimester: Secondary | ICD-10-CM

## 2024-01-24 DIAGNOSIS — Z3A25 25 weeks gestation of pregnancy: Secondary | ICD-10-CM

## 2024-01-24 DIAGNOSIS — O0992 Supervision of high risk pregnancy, unspecified, second trimester: Secondary | ICD-10-CM | POA: Diagnosis not present

## 2024-01-24 DIAGNOSIS — O99019 Anemia complicating pregnancy, unspecified trimester: Secondary | ICD-10-CM

## 2024-01-24 DIAGNOSIS — D563 Thalassemia minor: Secondary | ICD-10-CM

## 2024-01-24 DIAGNOSIS — O99012 Anemia complicating pregnancy, second trimester: Secondary | ICD-10-CM

## 2024-01-24 NOTE — Patient Instructions (Signed)

## 2024-01-24 NOTE — Progress Notes (Signed)
   PRENATAL VISIT NOTE  Subjective:  Christine [name] is a 23 y.o. G2P0010 at [redacted]w[redacted]d being seen today for ongoing prenatal care.  She is currently monitored for the following issues for this high-risk pregnancy and has Diabetes mellitus (HCC); Asthma; Hydradenitis; Supervision of high risk pregnancy, antepartum; Pre-existing type 2 diabetes mellitus during pregnancy; Alpha thalassemia silent carrier; and Sickle cell trait in mother affecting pregnancy on their problem list.  Patient reports no complaints.  Contractions: Not present. Vag. Bleeding: None.  Movement: Present. Denies leaking of fluid.   The following portions of the patient's history were reviewed and updated as appropriate: allergies, current medications, past family history, past medical history, past social history, past surgical history and problem list.   Objective:   Vitals:   01/24/24 1431  BP: 119/74  Pulse: 96  Weight: 212 lb 9.6 oz (96.4 kg)    Fetal Status:  Fetal Heart Rate (bpm): 158   Movement: Present    General: Alert, oriented and cooperative. Patient is in no acute distress.  Skin: Skin is warm and dry. No rash noted.   Cardiovascular: Normal heart rate noted  Respiratory: Normal respiratory effort, no problems with respiration noted  Abdomen: Soft, gravid, appropriate for gestational age.  Pain/Pressure: Absent     Pelvic: Cervical exam deferred        Extremities: Normal range of motion.  Edema: Trace  Mental Status: Normal mood and affect. Normal behavior. Normal judgment and thought content.   Assessment and Plan:  Pregnancy: G2P0010 at [redacted]w[redacted]d 1. Supervision of high risk pregnancy, antepartum (Primary) BP and FHR normal Doing well, feeling regular movement    2. Pre-existing type 2 diabetes mellitus during pregnancy in second trimester Has dexcom and omnipod 92% in range, reports sometimes high if she skips meal or eats something, discussed importance of eating every 2-3 hours, incorporating  protein Discussed delivery timing pending glucose control  11/12 u/s afi normal  EFW 94% Missed fetal echo, is rescheduling appt  Follow up growth  12/11 Antenatal testing starting  32 weeks  3. [redacted] weeks gestation of pregnancy Peds list provided   4. Alpha thalassemia silent carrier 5. Sickle cell trait in mother affecting pregnancy Met with genetic counselor    Preterm labor symptoms and general obstetric precautions including but not limited to vaginal bleeding, contractions, leaking of fluid and fetal movement were reviewed in detail with the patient. Please refer to After Visit Summary for other counseling recommendations.   Return in about 2 weeks (around 02/07/2024) for OB VISIT (MD or APP).  Future Appointments  Date Time Provider Department Center  02/06/2024  2:50 PM Trudy Leeroy NOVAK, MD [facility] None  02/14/2024  3:15 PM [facility] PROVIDER 1 [facility]  02/14/2024  3:30 PM [facility] US4 [facility]  03/10/2024 12:30 PM [facility] US  1 [facility]  03/12/2024  2:15 PM [facility] PROVIDER 1 [facility]  03/12/2024  2:30 PM [facility] US3 [facility]    Nidia Daring, FNP

## 2024-01-24 NOTE — Progress Notes (Signed)
 Pt DM2; has insuline pump right arm.   Pt states she is not taking the aspirin  nor prenatal because they were not at her pharmacy. Checked with pt and clarified listed pharmacy RX was sent to.  No other concerns at this time

## 2024-01-29 ENCOUNTER — Telehealth: Payer: Self-pay | Admitting: Dietician

## 2024-01-29 NOTE — Telephone Encounter (Signed)
 Called patient who states that she is doing well. Overall blood glucose is well controlled with fasting glucose <100.   She is using the OP5 insulin  pump in automated mode. Dexcom clarity report below.   Occasional lows which she is treating appropriately. Made an appointment to see patient in January for follow up. Patient with no questions at this time.  Leita Constable, RD, LDN, CDCES, DipACLM

## 2024-02-06 ENCOUNTER — Encounter

## 2024-02-14 ENCOUNTER — Ambulatory Visit: Admitting: Obstetrics and Gynecology

## 2024-02-14 ENCOUNTER — Ambulatory Visit: Attending: Obstetrics and Gynecology

## 2024-02-14 VITALS — BP 139/80 | HR 84

## 2024-02-14 DIAGNOSIS — Z3A28 28 weeks gestation of pregnancy: Secondary | ICD-10-CM

## 2024-02-14 DIAGNOSIS — O24113 Pre-existing diabetes mellitus, type 2, in pregnancy, third trimester: Secondary | ICD-10-CM

## 2024-02-14 DIAGNOSIS — O0990 Supervision of high risk pregnancy, unspecified, unspecified trimester: Secondary | ICD-10-CM | POA: Diagnosis not present

## 2024-02-14 DIAGNOSIS — O26843 Uterine size-date discrepancy, third trimester: Secondary | ICD-10-CM | POA: Diagnosis not present

## 2024-02-14 DIAGNOSIS — O26842 Uterine size-date discrepancy, second trimester: Secondary | ICD-10-CM | POA: Insufficient documentation

## 2024-02-14 DIAGNOSIS — E119 Type 2 diabetes mellitus without complications: Secondary | ICD-10-CM | POA: Diagnosis not present

## 2024-02-14 DIAGNOSIS — O99019 Anemia complicating pregnancy, unspecified trimester: Secondary | ICD-10-CM | POA: Diagnosis present

## 2024-02-14 DIAGNOSIS — J45909 Unspecified asthma, uncomplicated: Secondary | ICD-10-CM | POA: Diagnosis not present

## 2024-02-14 DIAGNOSIS — D573 Sickle-cell trait: Secondary | ICD-10-CM | POA: Diagnosis not present

## 2024-02-14 DIAGNOSIS — O24112 Pre-existing diabetes mellitus, type 2, in pregnancy, second trimester: Secondary | ICD-10-CM | POA: Insufficient documentation

## 2024-02-14 DIAGNOSIS — O99513 Diseases of the respiratory system complicating pregnancy, third trimester: Secondary | ICD-10-CM | POA: Diagnosis not present

## 2024-02-14 NOTE — Progress Notes (Signed)
 After review, MFM consult with provider is not indicated for today  Arna Ranks, MD 02/14/2024 9:08 PM  [facility]

## 2024-02-18 ENCOUNTER — Encounter: Admitting: Obstetrics and Gynecology

## 2024-02-19 ENCOUNTER — Inpatient Hospital Stay (HOSPITAL_COMMUNITY)
Admission: AD | Admit: 2024-02-19 | Discharge: 2024-02-19 | Disposition: A | Discharge status: Home / Self Care | Source: Home / Self Care | Attending: Obstetrics & Gynecology | Admitting: Obstetrics & Gynecology

## 2024-02-19 ENCOUNTER — Encounter (HOSPITAL_COMMUNITY): Payer: Self-pay | Admitting: Obstetrics & Gynecology

## 2024-02-19 ENCOUNTER — Other Ambulatory Visit: Payer: Self-pay

## 2024-02-19 DIAGNOSIS — R102 Pelvic and perineal pain unspecified side: Secondary | ICD-10-CM | POA: Diagnosis not present

## 2024-02-19 DIAGNOSIS — N949 Unspecified condition associated with female genital organs and menstrual cycle: Secondary | ICD-10-CM

## 2024-02-19 DIAGNOSIS — M25551 Pain in right hip: Secondary | ICD-10-CM | POA: Insufficient documentation

## 2024-02-19 DIAGNOSIS — R109 Unspecified abdominal pain: Secondary | ICD-10-CM | POA: Diagnosis not present

## 2024-02-19 DIAGNOSIS — Z3A28 28 weeks gestation of pregnancy: Secondary | ICD-10-CM

## 2024-02-19 DIAGNOSIS — O26893 Other specified pregnancy related conditions, third trimester: Secondary | ICD-10-CM | POA: Insufficient documentation

## 2024-02-19 LAB — URINALYSIS, ROUTINE W REFLEX MICROSCOPIC
Bilirubin Urine: NEGATIVE
Glucose, UA: NEGATIVE mg/dL
Ketones, ur: NEGATIVE mg/dL
Nitrite: NEGATIVE
Protein, ur: 100 mg/dL — AB
RBC / HPF: >50 RBC/hpf (ref 0–5)
Specific Gravity, Urine: 1.009 (ref 1.005–1.030)
WBC, UA: >50 WBC/hpf (ref 0–5)
pH: 8 (ref 5.0–8.0)

## 2024-02-19 MED ORDER — ACETAMINOPHEN 500 MG PO TABS
1000.0000 mg | ORAL_TABLET | Freq: Once | ORAL | Status: AC
  Administered 2024-02-19: 03:00:00 1000 mg via ORAL
  Filled 2024-02-19: qty 2

## 2024-02-19 MED ORDER — CYCLOBENZAPRINE HCL 5 MG PO TABS: 5.0000 mg | ORAL_TABLET | Freq: Three times a day (TID) | ORAL | 0 refills | Status: AC | PRN

## 2024-02-19 MED ORDER — CYCLOBENZAPRINE HCL 5 MG PO TABS
10.0000 mg | ORAL_TABLET | Freq: Once | ORAL | Status: AC
  Administered 2024-02-19: 03:00:00 10 mg via ORAL
  Filled 2024-02-19: qty 2

## 2024-02-19 NOTE — Discharge Instructions (Signed)
Round Ligament Massage & Stretches  Massage: Starting at the middle of your pubic bone, trace little circles in a wide U from your pubic bone to your hip bones on both sides.  Then starting just above your pubic bone, press in and down, alternating sides to create a gentle rocking of your uterus back and forth.  Move your hands up the sides of your belly and back down. Do this 3-5 times upon waking and before bed.  Stretches: Get on hands and knees and alternate arching your back deeply while inhaling, and then rounding your back while exhaling. Modified runners lunge:  - Sit on a chair with half of your bottom on the chair and half off.  - Sit up tall, plant your front foot, and stretch your other foot out behind you.  - Breathe deeply for 5 breaths and then do the other side.  

## 2024-02-19 NOTE — MAU Provider Note (Signed)
 History     CSN: [id number]  Arrival date and time: 02/19/24 0111   Event Date/Time   First Provider Initiated Contact with Patient 02/19/24 0254      Chief Complaint  Patient presents with   Abdominal Pain   Christine [name] , a  23 y.o. G2P0010 at [redacted]w[redacted]d presents to MAU with complaints of right sided lower hip pain. Patient reports since Sunday she has been experecining shapr pain on the lower right side of the abdomen that comes and goes. Reprots worsened by movement, like walking and rolling over in bed. Denies attempting to relieve pain at home. Denies fever chills or nausea and vomiting. Also denies vaginal bleeding, leaking of fluid, abnormal discharge and contractions. Endorses positive fetal movement.        Abdominal Pain Pertinent negatives include no constipation, diarrhea, dysuria, fever, headaches, nausea or vomiting.    OB History     Gravida  2   Para  0   Term  0   Preterm  0   AB  1   Living  0      SAB  1   IAB  0   Ectopic  0   Multiple  0   Live Births  0           Past Medical History:  Diagnosis Date   Abscess    Asthma    Diabetes mellitus without complication (HCC)    Rapid weight loss 08/19/2019   Temporomandibular joint (TMJ) pain 12/02/2019    Past Surgical History:  Procedure Laterality Date   ARM WOUND REPAIR / CLOSURE Right 12/2019   Right upper arm    CHOLECYSTECTOMY      Family History  Problem Relation Age of Onset   Hypertension Mother    Healthy Father    Diabetes Maternal Grandmother    Hypertension Maternal Grandmother    Diabetes Maternal Grandfather    Hypertension Maternal Grandfather     Social History[1]  Allergies: Allergies[2]  Medications Prior to Admission  Medication Sig Dispense Refill Last Dose/Taking   aspirin  EC 81 MG tablet Take 1 tablet (81 mg total) by mouth at bedtime. Start taking when you are [redacted] weeks pregnant for rest of pregnancy for prevention of preeclampsia (Patient not  taking: Reported on 01/24/2024) 300 tablet 2    Blood Pressure Monitoring (BLOOD PRESSURE KIT) DEVI 1 Device by Does not apply route once a week. (Patient not taking: Reported on 01/24/2024) 1 each 0    cefadroxil  (DURICEF) 500 MG capsule Take 1 capsule (500 mg total) by mouth 2 (two) times daily. (Patient not taking: Reported on 01/24/2024) 14 capsule 0    Continuous Blood Gluc Transmit (DEXCOM G6 TRANSMITTER) MISC USE AS DIRECTED TO TEST BLOOD SUGAR 1 each 3    Continuous Glucose Receiver (DEXCOM G7 RECEIVER) DEVI 1 Units by Does not apply route daily. 1 each 0    Continuous Glucose Sensor (DEXCOM G7 SENSOR) MISC Place one sensor on skin. Replace every 10 days. 3 each 2    insulin  aspart (NOVOLOG ) 100 UNIT/ML injection For use in Omnipod Pump. TDD 75 Units daily. (Patient not taking: Reported on 01/24/2024) 30 mL 11    Insulin  Disposable Pump (OMNIPOD 5 DEXG7G6 INTRO GEN 5) KIT Dispense 1 - to be used 365 days per year. 1 kit 0    insulin  glargine (LANTUS ) 100 UNIT/ML Solostar Pen Inject 10 Units into the skin in the morning. (Patient not taking: Reported on 01/24/2024) 3 mL  1    Insulin  Pen Needle (BD PEN NEEDLE NANO 2ND GEN) 32G X 4 MM MISC Please use to administer insulin . (Patient not taking: Reported on 01/24/2024) 200 each 5    Prenatal Vit-Fe Phos-FA-Omega (VITAFOL  GUMMIES) 3.33-0.333-34.8 MG CHEW Chew 1 tablet by mouth daily. (Patient not taking: Reported on 01/24/2024) 90 tablet 5     Review of Systems  Constitutional:  Negative for chills, fatigue and fever.  Eyes:  Negative for pain and visual disturbance.  Respiratory:  Negative for apnea, shortness of breath and wheezing.   Cardiovascular:  Negative for chest pain and palpitations.  Gastrointestinal:  Positive for abdominal pain. Negative for constipation, diarrhea, nausea and vomiting.  Genitourinary:  Negative for difficulty urinating, dysuria, pelvic pain, vaginal bleeding, vaginal discharge and vaginal pain.  Musculoskeletal:   Negative for back pain.  Neurological:  Negative for seizures, weakness and headaches.  Psychiatric/Behavioral:  Negative for suicidal ideas.    Physical Exam   Blood pressure 130/69, pulse 86, temperature 98.7 F (37.1 C), temperature source Oral, resp. rate 18, height 5' 6 (1.676 m), weight 96.5 kg, last menstrual period 08/28/2023, SpO2 100%.  Physical Exam Vitals and nursing note reviewed.  Constitutional:      General: She is not in acute distress.    Appearance: Normal appearance. She is not ill-appearing.     Comments: Patient resting upon arrival to room   HENT:     Head: Normocephalic.  Pulmonary:     Effort: Pulmonary effort is normal.  Abdominal:     Tenderness: There is no abdominal tenderness.  Musculoskeletal:     Cervical back: Normal range of motion.  Skin:    General: Skin is warm and dry.  Neurological:     Mental Status: She is alert and oriented to person, place, and time.  Psychiatric:        Mood and Affect: Mood normal.    FHT: 150 bpm with moderate variability 15x15 accels no decels  Toco: quiet  MAU Course  Procedures Orders Placed This Encounter  Procedures   Urinalysis, Routine w reflex microscopic -Urine, Clean Catch   Meds ordered this encounter  Medications   acetaminophen  (TYLENOL ) tablet 1,000 mg   cyclobenzaprine  (FLEXERIL ) tablet 10 mg    Results for orders placed or performed during the hospital encounter of 02/19/24 (from the past 24 hours)  Urinalysis, Routine w reflex microscopic -Urine, Clean Catch     Status: Abnormal   Collection Time: 02/19/24  2:50 AM  Result Value Ref Range   Color, Urine YELLOW YELLOW   APPearance HAZY (A) CLEAR   Specific Gravity, Urine 1.009 1.005 - 1.030   pH 8.0 5.0 - 8.0   Glucose, UA NEGATIVE NEGATIVE mg/dL   Hgb urine dipstick MODERATE (A) NEGATIVE   Bilirubin Urine NEGATIVE NEGATIVE   Ketones, ur NEGATIVE NEGATIVE mg/dL   Protein, ur 899 (A) NEGATIVE mg/dL   Nitrite NEGATIVE NEGATIVE    Leukocytes,Ua LARGE (A) NEGATIVE   RBC / HPF >50 0 - 5 RBC/hpf   WBC, UA >50 0 - 5 WBC/hpf   Bacteria, UA MANY (A) NONE SEEN   Squamous Epithelial / HPF 0-5 0 - 5 /HPF   Mucus PRESENT     MDM - UA positive for Hgb, protein leuks and bacteria. Plan to reflex to r/o UTI in pregnancy.  - Complaints consistent with Round ligament pain. Plan to treat with PO Tylenol  and flexeril   - Upon reassessment pain improved from a 5/10 to a  3 with PO meds.  - plan for discharge.   Assessment and Plan   1. Round ligament pain   2. [redacted] weeks gestation of pregnancy   3. Pain of right hip    - Reviewed round ligament pain as a normal discomfort of pregnancy.  - Reviewed OTC options for pain management and comfort measures including the use of a maternity support belt and stretching.  - Rx for flexeril  sent to outpatient pharmacy.  - Reviewed worsening signs and return precautions.  - Patient discharged home in stable condition and may return to MAU as needed.   Claris CHRISTELLA Cedar 02/19/2024, 2:54 AM      [1]  Social History Tobacco Use   Smoking status: Never   Smokeless tobacco: Never  Vaping Use   Vaping status: Never Used  Substance Use Topics   Alcohol use: No   Drug use: Not Currently  [2]  Allergies Allergen Reactions   Metformin  And Related Hives and Other (See Comments)    constipation

## 2024-02-19 NOTE — MAU Note (Signed)
 Christine [name] is a 24 y.o. at [redacted]w[redacted]d here in MAU reporting: Sunday evening started having sharp pains in lower right side of abdomen - pain has continued throughout today and it hurts to lay on that side. +FM. Denies VB or LOF.    Onset of complaint: Sunday  Pain score: 3 Vitals:   02/19/24 0122  BP: (!) 140/78  Pulse: 89  Resp: 18  Temp: 98.7 F (37.1 C)  SpO2: 100%     FHT: 150  Lab orders placed from triage: UA

## 2024-02-21 LAB — CULTURE, OB URINE: Culture: >=100000 — AB

## 2024-03-07 ENCOUNTER — Inpatient Hospital Stay (HOSPITAL_COMMUNITY)

## 2024-03-07 ENCOUNTER — Inpatient Hospital Stay (HOSPITAL_COMMUNITY)
Admission: AD | Admit: 2024-03-07 | Discharge: 2024-03-07 | Disposition: A | Discharge status: Home / Self Care | Attending: Obstetrics and Gynecology | Admitting: Obstetrics and Gynecology

## 2024-03-07 ENCOUNTER — Other Ambulatory Visit: Payer: Self-pay | Admitting: Student

## 2024-03-07 DIAGNOSIS — O24113 Pre-existing diabetes mellitus, type 2, in pregnancy, third trimester: Secondary | ICD-10-CM | POA: Insufficient documentation

## 2024-03-07 DIAGNOSIS — Z3A31 31 weeks gestation of pregnancy: Secondary | ICD-10-CM | POA: Diagnosis not present

## 2024-03-07 DIAGNOSIS — R059 Cough, unspecified: Secondary | ICD-10-CM | POA: Diagnosis present

## 2024-03-07 DIAGNOSIS — O26893 Other specified pregnancy related conditions, third trimester: Secondary | ICD-10-CM | POA: Diagnosis not present

## 2024-03-07 DIAGNOSIS — O99513 Diseases of the respiratory system complicating pregnancy, third trimester: Secondary | ICD-10-CM | POA: Insufficient documentation

## 2024-03-07 DIAGNOSIS — O99019 Anemia complicating pregnancy, unspecified trimester: Secondary | ICD-10-CM | POA: Insufficient documentation

## 2024-03-07 DIAGNOSIS — R000 Tachycardia, unspecified: Secondary | ICD-10-CM | POA: Insufficient documentation

## 2024-03-07 DIAGNOSIS — Z794 Long term (current) use of insulin: Secondary | ICD-10-CM | POA: Diagnosis not present

## 2024-03-07 DIAGNOSIS — O99013 Anemia complicating pregnancy, third trimester: Secondary | ICD-10-CM | POA: Diagnosis not present

## 2024-03-07 DIAGNOSIS — Z8709 Personal history of other diseases of the respiratory system: Secondary | ICD-10-CM | POA: Insufficient documentation

## 2024-03-07 DIAGNOSIS — J101 Influenza due to other identified influenza virus with other respiratory manifestations: Secondary | ICD-10-CM

## 2024-03-07 LAB — URINALYSIS, ROUTINE W REFLEX MICROSCOPIC
Bilirubin Urine: NEGATIVE
Glucose, UA: NEGATIVE mg/dL
Hgb urine dipstick: NEGATIVE
Ketones, ur: 20 mg/dL — AB
Nitrite: POSITIVE — AB
Protein, ur: 100 mg/dL — AB
Specific Gravity, Urine: 1.015 (ref 1.005–1.030)
WBC, UA: >50 WBC/hpf (ref 0–5)
pH: 5 (ref 5.0–8.0)

## 2024-03-07 LAB — CBC WITH DIFFERENTIAL/PLATELET
Abs Immature Granulocytes: 0.08 K/uL — ABNORMAL HIGH (ref 0.00–0.07)
Basophils Absolute: 0 K/uL (ref 0.0–0.1)
Basophils Relative: 0 %
Eosinophils Absolute: 0 K/uL (ref 0.0–0.5)
Eosinophils Relative: 0 %
HCT: 27.4 % — ABNORMAL LOW (ref 36.0–46.0)
Hemoglobin: 8.7 g/dL — ABNORMAL LOW (ref 12.0–15.0)
Immature Granulocytes: 1 %
Lymphocytes Relative: 10 %
Lymphs Abs: 1 K/uL (ref 0.7–4.0)
MCH: 23.6 pg — ABNORMAL LOW (ref 26.0–34.0)
MCHC: 31.8 g/dL (ref 30.0–36.0)
MCV: 74.3 fL — ABNORMAL LOW (ref 80.0–100.0)
Monocytes Absolute: 1.4 K/uL — ABNORMAL HIGH (ref 0.1–1.0)
Monocytes Relative: 13 %
Neutro Abs: 8 K/uL — ABNORMAL HIGH (ref 1.7–7.7)
Neutrophils Relative %: 76 %
Platelets: 428 K/uL — ABNORMAL HIGH (ref 150–400)
RBC: 3.69 MIL/uL — ABNORMAL LOW (ref 3.87–5.11)
RDW: 14.5 % (ref 11.5–15.5)
WBC: 10.4 K/uL (ref 4.0–10.5)
nRBC: 0 % (ref 0.0–0.2)

## 2024-03-07 LAB — COMPREHENSIVE METABOLIC PANEL WITH GFR
ALT: 12 U/L (ref 0–44)
AST: 15 U/L (ref 15–41)
Albumin: 3.4 g/dL — ABNORMAL LOW (ref 3.5–5.0)
Alkaline Phosphatase: 120 U/L (ref 38–126)
Anion gap: 13 (ref 5–15)
BUN: <5 mg/dL — ABNORMAL LOW (ref 6–20)
CO2: 21 mmol/L — ABNORMAL LOW (ref 22–32)
Calcium: 8.9 mg/dL (ref 8.9–10.3)
Chloride: 100 mmol/L (ref 98–111)
Creatinine, Ser: 0.63 mg/dL (ref 0.44–1.00)
GFR, Estimated: >60 mL/min
Glucose, Bld: 122 mg/dL — ABNORMAL HIGH (ref 70–99)
Potassium: 3.6 mmol/L (ref 3.5–5.1)
Sodium: 134 mmol/L — ABNORMAL LOW (ref 135–145)
Total Bilirubin: 0.4 mg/dL (ref 0.0–1.2)
Total Protein: 7.2 g/dL (ref 6.5–8.1)

## 2024-03-07 LAB — RESP PANEL BY RT-PCR (RSV, FLU A&B, COVID)  RVPGX2
Influenza A by PCR: POSITIVE — AB
Influenza B by PCR: NEGATIVE
Resp Syncytial Virus by PCR: NEGATIVE
SARS Coronavirus 2 by RT PCR: NEGATIVE

## 2024-03-07 LAB — FERRITIN: Ferritin: 19 ng/mL (ref 11–307)

## 2024-03-07 LAB — LACTIC ACID, PLASMA: Lactic Acid, Venous: 1 mmol/L (ref 0.5–1.9)

## 2024-03-07 MED ORDER — LACTATED RINGERS IV BOLUS
1000.0000 mL | Freq: Once | INTRAVENOUS | Status: AC
  Administered 2024-03-07: 1000 mL via INTRAVENOUS

## 2024-03-07 MED ORDER — GUAIFENESIN 100 MG/5ML PO LIQD
15.0000 mL | Freq: Once | ORAL | Status: AC
  Administered 2024-03-07: 15 mL via ORAL
  Filled 2024-03-07: qty 15

## 2024-03-07 MED ORDER — OSELTAMIVIR PHOSPHATE 75 MG PO CAPS: 75.0000 mg | ORAL_CAPSULE | Freq: Two times a day (BID) | ORAL | 0 refills | Status: AC

## 2024-03-07 MED ORDER — ACETAMINOPHEN 500 MG PO TABS
1000.0000 mg | ORAL_TABLET | Freq: Once | ORAL | Status: AC
  Administered 2024-03-07: 1000 mg via ORAL
  Filled 2024-03-07: qty 2

## 2024-03-07 NOTE — MAU Provider Note (Addendum)
 " History     CSN: [id number]  Arrival date and time: 03/07/24 1555   Event Date/Time   First Provider Initiated Contact with Patient 03/07/24 1745      Chief Complaint  Patient presents with   Cough   Fatigue   Fever   Dizziness   HPI Christine [name] is a 24 y.o. G2P0010 at [redacted]w[redacted]d who presents with cough, fatigue, & chills. Symptoms started 2 days ago. Believes she got sick from someone at work. Symptoms include, cough, congestion, fatigue, SOB, chills. Hasn't treated symptoms. Denies n/v/d, CP, wheezing, abdominal pain, vaginal bleeding, or LOF. Reports good fetal movement. Has hx of asthma; hasn't had to use inhaler in years. Never been admitted or intubated d/t asthma. Denies wheezing or chest tightness.   OB History     Gravida  2   Para  0   Term  0   Preterm  0   AB  1   Living  0      SAB  1   IAB  0   Ectopic  0   Multiple  0   Live Births  0           Past Medical History:  Diagnosis Date   Abscess    Asthma    Diabetes mellitus without complication (HCC)    Rapid weight loss 08/19/2019   Temporomandibular joint (TMJ) pain 12/02/2019    Past Surgical History:  Procedure Laterality Date   ARM WOUND REPAIR / CLOSURE Right 12/2019   Right upper arm    CHOLECYSTECTOMY      Family History  Problem Relation Age of Onset   Hypertension Mother    Healthy Father    Diabetes Maternal Grandmother    Hypertension Maternal Grandmother    Diabetes Maternal Grandfather    Hypertension Maternal Grandfather     Social History[1]  Allergies: Allergies[2]  Medications Prior to Admission  Medication Sig Dispense Refill Last Dose/Taking   Continuous Glucose Receiver (DEXCOM G7 RECEIVER) DEVI 1 Units by Does not apply route daily. 1 each 0 03/07/2024   Continuous Glucose Sensor (DEXCOM G7 SENSOR) MISC Place one sensor on skin. Replace every 10 days. 3 each 2 03/07/2024   Insulin  Disposable Pump (OMNIPOD 5 DEXG7G6 INTRO GEN 5) KIT Dispense 1 - to be used  365 days per year. 1 kit 0 03/07/2024   aspirin  EC 81 MG tablet Take 1 tablet (81 mg total) by mouth at bedtime. Start taking when you are [redacted] weeks pregnant for rest of pregnancy for prevention of preeclampsia (Patient not taking: Reported on 01/24/2024) 300 tablet 2    Blood Pressure Monitoring (BLOOD PRESSURE KIT) DEVI 1 Device by Does not apply route once a week. (Patient not taking: Reported on 01/24/2024) 1 each 0    cefadroxil  (DURICEF) 500 MG capsule Take 1 capsule (500 mg total) by mouth 2 (two) times daily. (Patient not taking: Reported on 01/24/2024) 14 capsule 0    Continuous Blood Gluc Transmit (DEXCOM G6 TRANSMITTER) MISC USE AS DIRECTED TO TEST BLOOD SUGAR 1 each 3    cyclobenzaprine  (FLEXERIL ) 5 MG tablet Take 1 tablet (5 mg total) by mouth 3 (three) times daily as needed for muscle spasms. 20 tablet 0    insulin  aspart (NOVOLOG ) 100 UNIT/ML injection For use in Omnipod Pump. TDD 75 Units daily. (Patient not taking: Reported on 01/24/2024) 30 mL 11    insulin  glargine (LANTUS ) 100 UNIT/ML Solostar Pen Inject 10 Units into the skin in the  morning. (Patient not taking: Reported on 01/24/2024) 3 mL 1    Insulin  Pen Needle (BD PEN NEEDLE NANO 2ND GEN) 32G X 4 MM MISC Please use to administer insulin . (Patient not taking: Reported on 01/24/2024) 200 each 5    Prenatal Vit-Fe Phos-FA-Omega (VITAFOL  GUMMIES) 3.33-0.333-34.8 MG CHEW Chew 1 tablet by mouth daily. (Patient not taking: Reported on 01/24/2024) 90 tablet 5     Review of Systems  All other systems reviewed and are negative.  Physical Exam   Blood pressure (!) 124/57, pulse (!) 106, temperature 98.4 F (36.9 C), temperature source Oral, resp. rate 17, last menstrual period 08/28/2023, SpO2 98%.  Physical Exam Vitals and nursing note reviewed.  Constitutional:      General: She is not in acute distress.    Appearance: She is well-developed. She is not ill-appearing.  HENT:     Head: Normocephalic and atraumatic.  Eyes:      General: No scleral icterus.       Right eye: No discharge.        Left eye: No discharge.     Conjunctiva/sclera: Conjunctivae normal.  Cardiovascular:     Rate and Rhythm: Regular rhythm. Tachycardia present.  Pulmonary:     Effort: Pulmonary effort is normal. No respiratory distress.     Breath sounds: Normal breath sounds. No wheezing or rhonchi.  Neurological:     General: No focal deficit present.     Mental Status: She is alert.  Psychiatric:        Mood and Affect: Mood normal.        Behavior: Behavior normal.    NST:  Baseline: 155 bpm, Variability: Good {> 6 bpm), Accelerations: Reactive, and Decelerations: Absent  MAU Course  Procedures Results for orders placed or performed during the hospital encounter of 03/07/24 (from the past 24 hours)  Resp panel by RT-PCR (RSV, Flu A&B, Covid) Anterior Nasal Swab     Status: Abnormal   Collection Time: 03/07/24  5:41 PM   Specimen: Anterior Nasal Swab  Result Value Ref Range   SARS Coronavirus 2 by RT PCR NEGATIVE NEGATIVE   Influenza A by PCR POSITIVE (A) NEGATIVE   Influenza B by PCR NEGATIVE NEGATIVE   Resp Syncytial Virus by PCR NEGATIVE NEGATIVE  CBC with Differential/Platelet     Status: Abnormal   Collection Time: 03/07/24  6:10 PM  Result Value Ref Range   WBC 10.4 4.0 - 10.5 K/uL   RBC 3.69 (L) 3.87 - 5.11 MIL/uL   Hemoglobin 8.7 (L) 12.0 - 15.0 g/dL   HCT 72.5 (L) 63.9 - 53.9 %   MCV 74.3 (L) 80.0 - 100.0 fL   MCH 23.6 (L) 26.0 - 34.0 pg   MCHC 31.8 30.0 - 36.0 g/dL   RDW 85.4 88.4 - 84.4 %   Platelets 428 (H) 150 - 400 K/uL   nRBC 0.0 0.0 - 0.2 %   Neutrophils Relative % 76 %   Neutro Abs 8.0 (H) 1.7 - 7.7 K/uL   Lymphocytes Relative 10 %   Lymphs Abs 1.0 0.7 - 4.0 K/uL   Monocytes Relative 13 %   Monocytes Absolute 1.4 (H) 0.1 - 1.0 K/uL   Eosinophils Relative 0 %   Eosinophils Absolute 0.0 0.0 - 0.5 K/uL   Basophils Relative 0 %   Basophils Absolute 0.0 0.0 - 0.1 K/uL   Immature Granulocytes 1 %    Abs Immature Granulocytes 0.08 (H) 0.00 - 0.07 K/uL  Comprehensive metabolic panel with  GFR     Status: Abnormal   Collection Time: 03/07/24  6:10 PM  Result Value Ref Range   Sodium 134 (L) 135 - 145 mmol/L   Potassium 3.6 3.5 - 5.1 mmol/L   Chloride 100 98 - 111 mmol/L   CO2 21 (L) 22 - 32 mmol/L   Glucose, Bld 122 (H) 70 - 99 mg/dL   BUN <5 (L) 6 - 20 mg/dL   Creatinine, Ser 9.36 0.44 - 1.00 mg/dL   Calcium 8.9 8.9 - 89.6 mg/dL   Total Protein 7.2 6.5 - 8.1 g/dL   Albumin 3.4 (L) 3.5 - 5.0 g/dL   AST 15 15 - 41 U/L   ALT 12 0 - 44 U/L   Alkaline Phosphatase 120 38 - 126 U/L   Total Bilirubin 0.4 0.0 - 1.2 mg/dL   GFR, Estimated >39 >39 mL/min   Anion gap 13 5 - 15  Lactic acid, plasma     Status: None   Collection Time: 03/07/24  6:10 PM  Result Value Ref Range   Lactic Acid, Venous 1.0 0.5 - 1.9 mmol/L   DG Chest Portable 1 View Result Date: 03/07/2024 EXAM: 1 VIEW(S) XRAY OF THE CHEST 03/07/2024 06:10:00 PM COMPARISON: CXR 12/14/2007 CLINICAL HISTORY: shortness of breath, cough, fever FINDINGS: LUNGS AND PLEURA: Low lung volumes. No focal pulmonary opacity. No pleural effusion. No pneumothorax. HEART AND MEDIASTINUM: No acute abnormality of the cardiac and mediastinal silhouettes. BONES AND SOFT TISSUES: No acute osseous abnormality. IMPRESSION: 1. No acute cardiopulmonary findings. 2. Low lung volumes. Electronically signed by: Morgane Naveau MD 03/07/2024 07:08 PM EST RP Workstation: [id number]    MDM   Assessment and Plan   1. Influenza A   2. Anemia during pregnancy in third trimester   3. [redacted] weeks gestation of pregnancy    -Patient presents with 2 day history of chills, cough, & fatigue. Resp panel positive for Flu A. Vital signs & symptoms improved with IV fluids, tylenol , & robitussin given in MAU.  -Rx tamiflu & given list of pregnancy safe meds to treat symptoms -Reviewed return precautions  -Patient with T2DM. Has missed recent OB visits & unsure who is  managing her insulin . BS 122 today. Message sent to [facility] office for DM appt with Rasch NP -Message to [facility] to reschedule OB appt asap  -Hgb today 8.7. Ferritin pending. If low will order IV iron infusions  Rocky Satterfield 03/07/2024, 8:59 PM      [1]  Social History Tobacco Use   Smoking status: Never   Smokeless tobacco: Never  Vaping Use   Vaping status: Never Used  Substance Use Topics   Alcohol use: No   Drug use: Not Currently  [2]  Allergies Allergen Reactions   Metformin  And Related Hives and Other (See Comments)    constipation   "

## 2024-03-07 NOTE — MAU Note (Signed)
 Christine [name] is a 24 y.o. at [redacted]w[redacted]d here in MAU reporting: started feeling bad 2 days ago and started getting worse last night. States it feels like she has a fever, SOB, congestion, dizziness, fatigue and headache. Has not taken any OTC meds for symptoms. Denies any CTXs, LOF or VB. Reports +FM  Denies any N/V/D.  Onset of complaint: 2 days ago  Pain score: HA 7  generalized 9   Vitals:   03/07/24 1734  BP: 128/72  Pulse: (!) 123  Resp: 19  Temp: 100 F (37.8 C)  SpO2: 100%     FHT:174 Lab orders placed from triage:  resp panel

## 2024-03-07 NOTE — Discharge Instructions (Signed)

## 2024-03-08 ENCOUNTER — Ambulatory Visit: Payer: Self-pay | Admitting: Obstetrics and Gynecology

## 2024-03-08 DIAGNOSIS — N3000 Acute cystitis without hematuria: Secondary | ICD-10-CM

## 2024-03-08 DIAGNOSIS — O2343 Unspecified infection of urinary tract in pregnancy, third trimester: Secondary | ICD-10-CM

## 2024-03-08 MED ORDER — CEFADROXIL 500 MG PO CAPS: 500.0000 mg | ORAL_CAPSULE | Freq: Two times a day (BID) | ORAL | 0 refills | Status: AC

## 2024-03-10 ENCOUNTER — Telehealth: Payer: Self-pay | Admitting: *Deleted

## 2024-03-10 ENCOUNTER — Telehealth (HOSPITAL_COMMUNITY): Payer: Self-pay

## 2024-03-10 ENCOUNTER — Other Ambulatory Visit

## 2024-03-10 DIAGNOSIS — D509 Iron deficiency anemia, unspecified: Secondary | ICD-10-CM | POA: Insufficient documentation

## 2024-03-10 NOTE — Telephone Encounter (Signed)
 Patient asked for office to call back at 2:00 PM or after.

## 2024-03-10 NOTE — Telephone Encounter (Signed)
-----   Message from Rocky Satterfield, NP sent at 03/07/2024  9:03 PM EST ----- Regarding: DM appt Per Delon, please schedule DM appointment with her on 1/9  Thanks!

## 2024-03-10 NOTE — Telephone Encounter (Signed)
 Auth Submission: NO AUTH NEEDED Site of care: Site of care: [facility] Payer: [employer] [employer] Medication & CPT/J Code(s) submitted: Venofer (Iron Sucrose) J1756 Diagnosis Code: D50.9, O99.019 Route of submission (phone, fax, portal):  Phone # Fax # Auth type: Buy/Bill HB Units/visits requested: 200mg  x 5 doses Reference number:  Approval from: 03/10/24 to 06/03/24

## 2024-03-10 NOTE — Telephone Encounter (Signed)
 Called patient x 2. Call screened prior to call being disconnected by patient.

## 2024-03-12 ENCOUNTER — Ambulatory Visit: Attending: Obstetrics and Gynecology

## 2024-03-12 ENCOUNTER — Ambulatory Visit: Admitting: Obstetrics

## 2024-03-12 VITALS — BP 116/74 | HR 104

## 2024-03-12 DIAGNOSIS — O0990 Supervision of high risk pregnancy, unspecified, unspecified trimester: Secondary | ICD-10-CM | POA: Insufficient documentation

## 2024-03-12 DIAGNOSIS — O24113 Pre-existing diabetes mellitus, type 2, in pregnancy, third trimester: Secondary | ICD-10-CM

## 2024-03-12 DIAGNOSIS — O358XX0 Maternal care for other (suspected) fetal abnormality and damage, not applicable or unspecified: Secondary | ICD-10-CM | POA: Diagnosis present

## 2024-03-12 DIAGNOSIS — O24112 Pre-existing diabetes mellitus, type 2, in pregnancy, second trimester: Secondary | ICD-10-CM | POA: Insufficient documentation

## 2024-03-12 DIAGNOSIS — Z3A32 32 weeks gestation of pregnancy: Secondary | ICD-10-CM | POA: Insufficient documentation

## 2024-03-12 DIAGNOSIS — E119 Type 2 diabetes mellitus without complications: Secondary | ICD-10-CM

## 2024-03-12 NOTE — Progress Notes (Signed)
 MFM Consult Note  Christine [name] is currently at [redacted]w[redacted]d. She has been followed due to pregestational diabetes that is currently treated with an insulin  pump.    She denies any problems since her last exam and reports that her blood sugars have mostly been within the normal range.  Her blood pressure today was 116/74.  Sonographic findings Single intrauterine pregnancy at 32w 0d. Fetal cardiac activity: Observed. Presentation: Cephalic. Fetal biometry shows the estimated fetal weight of 4 lb 14 oz,  2198g (84%). Amniotic fluid: Within normal limits. AFI: 14.59 cm.  MVP: 5.63 cm. Placenta: Anterior. BPP: 8/8.   Due to pregestational diabetes, we will continue to follow her with weekly fetal testing until delivery.  Should her glycemic control remain within normal limits, delivery will be recommended at around 39 weeks.    She will return in 1 week for a BPP.  The patient stated that all of her questions were answered.   A total of 20 minutes was spent counseling and coordinating the care for this patient.  Greater than 50% of the time was spent in direct face-to-face contact.

## 2024-03-13 ENCOUNTER — Inpatient Hospital Stay (HOSPITAL_COMMUNITY)
Admission: AD | Admit: 2024-03-13 | Discharge: 2024-03-13 | Disposition: A | Discharge status: Home / Self Care | Payer: Self-pay | Attending: Obstetrics & Gynecology | Admitting: Obstetrics & Gynecology

## 2024-03-13 ENCOUNTER — Encounter (HOSPITAL_COMMUNITY): Payer: Self-pay | Admitting: Obstetrics & Gynecology

## 2024-03-13 DIAGNOSIS — Z3689 Encounter for other specified antenatal screening: Secondary | ICD-10-CM

## 2024-03-13 DIAGNOSIS — Z3A32 32 weeks gestation of pregnancy: Secondary | ICD-10-CM | POA: Diagnosis not present

## 2024-03-13 DIAGNOSIS — M549 Dorsalgia, unspecified: Secondary | ICD-10-CM | POA: Diagnosis not present

## 2024-03-13 DIAGNOSIS — O26893 Other specified pregnancy related conditions, third trimester: Secondary | ICD-10-CM | POA: Insufficient documentation

## 2024-03-13 DIAGNOSIS — O2343 Unspecified infection of urinary tract in pregnancy, third trimester: Secondary | ICD-10-CM | POA: Diagnosis not present

## 2024-03-13 DIAGNOSIS — O26899 Other specified pregnancy related conditions, unspecified trimester: Secondary | ICD-10-CM

## 2024-03-13 DIAGNOSIS — R102 Pelvic and perineal pain unspecified side: Secondary | ICD-10-CM | POA: Insufficient documentation

## 2024-03-13 DIAGNOSIS — O99019 Anemia complicating pregnancy, unspecified trimester: Secondary | ICD-10-CM

## 2024-03-13 DIAGNOSIS — O0990 Supervision of high risk pregnancy, unspecified, unspecified trimester: Secondary | ICD-10-CM

## 2024-03-13 DIAGNOSIS — O2340 Unspecified infection of urinary tract in pregnancy, unspecified trimester: Secondary | ICD-10-CM

## 2024-03-13 LAB — URINALYSIS, MICROSCOPIC (REFLEX)
RBC / HPF: >50 RBC/hpf (ref 0–5)
WBC, UA: >50 WBC/hpf (ref 0–5)

## 2024-03-13 LAB — URINALYSIS, ROUTINE W REFLEX MICROSCOPIC
Bilirubin Urine: NEGATIVE
Glucose, UA: NEGATIVE mg/dL
Ketones, ur: NEGATIVE mg/dL
Nitrite: POSITIVE — AB
Protein, ur: 100 mg/dL — AB
Specific Gravity, Urine: 1.025 (ref 1.005–1.030)
pH: 6 (ref 5.0–8.0)

## 2024-03-13 LAB — WET PREP, GENITAL
Clue Cells Wet Prep HPF POC: NONE SEEN
Sperm: NONE SEEN
Trich, Wet Prep: NONE SEEN
WBC, Wet Prep HPF POC: <10
Yeast Wet Prep HPF POC: NONE SEEN

## 2024-03-13 MED ORDER — CEFADROXIL 500 MG PO CAPS
500.0000 mg | ORAL_CAPSULE | Freq: Once | ORAL | Status: DC
  Filled 2024-03-13: qty 1

## 2024-03-13 MED ORDER — ACETAMINOPHEN 500 MG PO TABS
1000.0000 mg | ORAL_TABLET | Freq: Once | ORAL | Status: AC
  Administered 2024-03-13: 1000 mg via ORAL
  Filled 2024-03-13: qty 2

## 2024-03-13 MED ORDER — CYCLOBENZAPRINE HCL 10 MG PO TABS
10.0000 mg | ORAL_TABLET | Freq: Once | ORAL | Status: AC
  Administered 2024-03-13: 10 mg via ORAL
  Filled 2024-03-13: qty 1

## 2024-03-13 NOTE — MAU Provider Note (Signed)
 " History     CSN: [id number]  Arrival date and time: 03/13/24 2013   Event Date/Time   First Provider Initiated Contact with Patient 03/13/24 2050      Chief Complaint  Patient presents with   Pelvic Pain   Christine [name] is a 24 y.o. G2P0010 at [redacted]w[redacted]d who receives care at [facility].  She presents today for pelvic pressure.  She denies discharge and endorses sex in the past 3 days, but no pain or discomfort.    Patient endorses fetal movement. She denies abdominal cramping and is unsure of contractions. She reports the pain is improved with trying to relax, but has no worsening factors.  She rates the pain a 5.5/10.  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    OB History     Gravida  2   Para  0   Term  0   Preterm  0   AB  1   Living  0      SAB  1   IAB  0   Ectopic  0   Multiple  0   Live Births  0           Past Medical History:  Diagnosis Date   Abscess    Asthma    Diabetes mellitus without complication (HCC)    Rapid weight loss 08/19/2019   Temporomandibular joint (TMJ) pain 12/02/2019    Past Surgical History:  Procedure Laterality Date   ARM WOUND REPAIR / CLOSURE Right 12/2019   Right upper arm    CHOLECYSTECTOMY      Family History  Problem Relation Age of Onset   Hypertension Mother    Healthy Father    Diabetes Maternal Grandmother    Hypertension Maternal Grandmother    Diabetes Maternal Grandfather    Hypertension Maternal Grandfather     Social History[1]  Allergies: Allergies[2]  Medications Prior to Admission  Medication Sig Dispense Refill Last Dose/Taking   aspirin  EC 81 MG tablet Take 1 tablet (81 mg total) by mouth at bedtime. Start taking when you are [redacted] weeks pregnant for rest of pregnancy for prevention of preeclampsia (Patient not taking: Reported on 01/24/2024) 300 tablet 2    Blood Pressure Monitoring (BLOOD PRESSURE KIT) DEVI 1 Device by Does not apply  route once a week. (Patient not taking: Reported on 01/24/2024) 1 each 0    cefadroxil  (DURICEF) 500 MG capsule Take 1 capsule (500 mg total) by mouth 2 (two) times daily. 14 capsule 0    Continuous Blood Gluc Transmit (DEXCOM G6 TRANSMITTER) MISC USE AS DIRECTED TO TEST BLOOD SUGAR 1 each 3    Continuous Glucose Receiver (DEXCOM G7 RECEIVER) DEVI 1 Units by Does not apply route daily. 1 each 0    Continuous Glucose Sensor (DEXCOM G7 SENSOR) MISC Place one sensor on skin. Replace every 10 days. 3 each 2    cyclobenzaprine  (FLEXERIL ) 5 MG tablet Take 1 tablet (5 mg total) by mouth 3 (three) times daily as needed for muscle spasms. 20 tablet 0    insulin  aspart (NOVOLOG ) 100 UNIT/ML injection For use in Omnipod Pump. TDD 75 Units daily. (Patient not taking: Reported on 01/24/2024) 30 mL 11    Insulin  Disposable Pump (OMNIPOD 5 DEXG7G6 INTRO GEN 5) KIT Dispense 1 - to be used 365 days per year. 1 kit 0    insulin  glargine (LANTUS ) 100 UNIT/ML Solostar Pen Inject 10 Units into  the skin in the morning. (Patient not taking: Reported on 01/24/2024) 3 mL 1    Insulin  Pen Needle (BD PEN NEEDLE NANO 2ND GEN) 32G X 4 MM MISC Please use to administer insulin . (Patient not taking: Reported on 01/24/2024) 200 each 5    Prenatal Vit-Fe Phos-FA-Omega (VITAFOL  GUMMIES) 3.33-0.333-34.8 MG CHEW Chew 1 tablet by mouth daily. (Patient not taking: Reported on 01/24/2024) 90 tablet 5     Review of Systems  Gastrointestinal:  Negative for abdominal pain, constipation, diarrhea, nausea and vomiting.  Genitourinary:  Positive for pelvic pain. Negative for difficulty urinating and dysuria.  Musculoskeletal:  Positive for back pain.  Neurological:  Negative for dizziness, light-headedness and headaches.   Physical Exam   Blood pressure 127/77, pulse (!) 106, temperature 97.9 F (36.6 C), resp. rate 18, height 5' 6 (1.676 m), weight 95.3 kg, last menstrual period 08/28/2023, SpO2 100%.  Physical Exam Vitals and nursing  note reviewed. Exam conducted with a chaperone present [facility], [facility] FUNDRAISER).  Constitutional:      Appearance: Normal appearance.  HENT:     Head: Normocephalic and atraumatic.  Eyes:     Conjunctiva/sclera: Conjunctivae normal.  Cardiovascular:     Rate and Rhythm: Normal rate.  Pulmonary:     Effort: Pulmonary effort is normal. No respiratory distress.  Genitourinary:    Comments:  Speculum Exam: -Normal External Genitalia: Non tender, sm amt clear mucoid discharge at introitus.  -Vaginal Vault: Pink mucosa with good rugae. Scant amt clearish white discharge -wet prep collected -Cervix:Pink, no lesions, cysts, or polyps.  Appears closed. No active bleeding from os-GC/CT collected -Bimanual Exam: Dilation: Closed Effacement (%): Thick Station: Ballotable Exam by:: Harlene Duncans, CNM Musculoskeletal:        General: Normal range of motion.     Cervical back: Normal range of motion.  Skin:    General: Skin is warm and dry.  Neurological:     Mental Status: She is alert and oriented to person, place, and time.  Psychiatric:        Mood and Affect: Mood normal.        Behavior: Behavior normal.     Fetal Assessment 150 bpm, Mod Var, -Decels, +15x15Accels Toco: No ctx graphed  MAU Course   Results for orders placed or performed during the hospital encounter of 03/13/24 (from the past 24 hours)  Urinalysis, Routine w reflex microscopic -Urine, Clean Catch     Status: Abnormal   Collection Time: 03/13/24  8:40 PM  Result Value Ref Range   Color, Urine YELLOW YELLOW   APPearance TURBID (A) CLEAR   Specific Gravity, Urine 1.025 1.005 - 1.030   pH 6.0 5.0 - 8.0   Glucose, UA NEGATIVE NEGATIVE mg/dL   Hgb urine dipstick SMALL (A) NEGATIVE   Bilirubin Urine NEGATIVE NEGATIVE   Ketones, ur NEGATIVE NEGATIVE mg/dL   Protein, ur 899 (A) NEGATIVE mg/dL   Nitrite POSITIVE (A) NEGATIVE   Leukocytes,Ua LARGE (A) NEGATIVE  Urinalysis, Microscopic (reflex)     Status: Abnormal   Collection  Time: 03/13/24  8:40 PM  Result Value Ref Range   RBC / HPF >50 0 - 5 RBC/hpf   WBC, UA >50 0 - 5 WBC/hpf   Bacteria, UA RARE (A) NONE SEEN   Squamous Epithelial / HPF 0-5 0 - 5 /HPF   WBC Clumps PRESENT    Mucus PRESENT   Wet prep, genital     Status: None   Collection Time: 03/13/24  9:00 PM  Specimen: PATH Cytology Cervicovaginal Ancillary Only  Result Value Ref Range   Yeast Wet Prep HPF POC NONE SEEN NONE SEEN   Trich, Wet Prep NONE SEEN NONE SEEN   Clue Cells Wet Prep HPF POC NONE SEEN NONE SEEN   WBC, Wet Prep HPF POC <10 <10   Sperm NONE SEEN    No results found.   MDM PE Labs: UA,  Wet Prep, GC/CT EFM Pain Medication Heating Pad Assessment and Plan   24 year old G2P0010  SIUP at 32.1 weeks Cat I FT Pelvic Pain  -POC Reviewed. -Patient states she has flexeril  at home, but did not take it.  -Patient agreeable to dosing. -Tylenol  and Flexeril  ordered.  -Exam performed and findings discussed. -Cultures collected. -Informed that if abnormal will send treatment as appropriate. -Will also give heating pad for back pain.    Harlene LITTIE Duncans MSN, CNM 03/13/2024, 8:50 PM   Reassessment (10:04 PM) -UA returns suggestive of infection.  However, review of previous labs and medication shows patient currently being treated. -Provider to bedside and patient states she has not taken medication since yesterday. -Reviewed importance of medication adherence for appropriate treatment. -Also discussed how infection could be contributing and/or worsening pelvic pain. -Patient reports pain has improved with medications and discussed taking at home. -Precautions reviewed. -Encouraged to call primary office or return to MAU if symptoms worsen or with the onset of new symptoms. -Discharged to home in stable condition.  Harlene LITTIE Duncans MSN, CNM Advanced Practice Provider, [facility] [employer] [facility]      [1]  Social History Tobacco Use   Smoking status: Never    Smokeless tobacco: Never  Vaping Use   Vaping status: Never Used  Substance Use Topics   Alcohol use: No   Drug use: Not Currently  [2]  Allergies Allergen Reactions   Metformin  And Related Hives and Other (See Comments)    constipation   "

## 2024-03-13 NOTE — MAU Note (Signed)
 Christine [name] is a 24 y.o. at [redacted]w[redacted]d here in MAU reporting pelvic pain since this am. Worse when she walks. States pain comes and goes and is sharp and pressure. Pain also goes thru to her back. Denies VB or LOF and reports good FM  LMP: na Onset of complaint: this am Pain score: 7 Vitals:   03/13/24 2022  BP: 130/81  Pulse: 94  Resp: 18  Temp: 97.9 F (36.6 C)  SpO2: 100%     FHT: 145  Lab orders placed from triage: ua

## 2024-03-14 ENCOUNTER — Ambulatory Visit (HOSPITAL_COMMUNITY): Payer: Self-pay

## 2024-03-14 DIAGNOSIS — A749 Chlamydial infection, unspecified: Secondary | ICD-10-CM

## 2024-03-14 LAB — GC/CHLAMYDIA PROBE AMP (~~LOC~~) NOT AT ARMC
Chlamydia: POSITIVE — AB
Comment: NEGATIVE
Comment: NORMAL
Neisseria Gonorrhea: NEGATIVE

## 2024-03-15 LAB — CULTURE, OB URINE: Culture: >=100000 — AB

## 2024-03-16 ENCOUNTER — Encounter: Payer: Self-pay | Admitting: Family Medicine

## 2024-03-17 ENCOUNTER — Encounter: Admitting: Obstetrics and Gynecology

## 2024-03-17 ENCOUNTER — Other Ambulatory Visit: Payer: Self-pay | Admitting: Student

## 2024-03-17 DIAGNOSIS — O24112 Pre-existing diabetes mellitus, type 2, in pregnancy, second trimester: Secondary | ICD-10-CM

## 2024-03-18 ENCOUNTER — Encounter: Attending: Obstetrics and Gynecology | Admitting: Dietician

## 2024-03-18 ENCOUNTER — Other Ambulatory Visit: Payer: Self-pay

## 2024-03-18 ENCOUNTER — Telehealth: Payer: Self-pay | Admitting: General Practice

## 2024-03-18 ENCOUNTER — Ambulatory Visit: Admitting: Dietician

## 2024-03-18 DIAGNOSIS — O24113 Pre-existing diabetes mellitus, type 2, in pregnancy, third trimester: Secondary | ICD-10-CM | POA: Insufficient documentation

## 2024-03-18 DIAGNOSIS — Z794 Long term (current) use of insulin: Secondary | ICD-10-CM | POA: Insufficient documentation

## 2024-03-18 DIAGNOSIS — Z3A32 32 weeks gestation of pregnancy: Secondary | ICD-10-CM | POA: Insufficient documentation

## 2024-03-18 DIAGNOSIS — Z3A14 14 weeks gestation of pregnancy: Secondary | ICD-10-CM

## 2024-03-18 DIAGNOSIS — O24112 Pre-existing diabetes mellitus, type 2, in pregnancy, second trimester: Secondary | ICD-10-CM

## 2024-03-18 DIAGNOSIS — Z713 Dietary counseling and surveillance: Secondary | ICD-10-CM | POA: Insufficient documentation

## 2024-03-18 DIAGNOSIS — Z9641 Presence of insulin pump (external) (internal): Secondary | ICD-10-CM | POA: Insufficient documentation

## 2024-03-18 DIAGNOSIS — A749 Chlamydial infection, unspecified: Secondary | ICD-10-CM | POA: Insufficient documentation

## 2024-03-18 MED ORDER — AZITHROMYCIN 500 MG PO TABS: 1000.0000 mg | ORAL_TABLET | Freq: Once | ORAL | 0 refills | Status: AC

## 2024-03-18 NOTE — Progress Notes (Signed)
 Patient was seen for Pre-existing Diabetes During Pregnancy on 11/13/2023  Start time 1022 and End time 1123   Estimated due date: 05/10/2023; [redacted]w[redacted]d  She was trained on the Omnipod 5 insulin  pump on 11/29/2023.  She is using this appropriately.  She notes that her blood glucose is harder to control now than previously.  Discussed that sometimes she may need to change the pod early if the blood glucose remains high as this may be a sign that the canula is occluded. She is using the pump in Automated mode most often. Sensor reading now was 86.  Pump settings today: and adjustments made Basal rate 0.9 units per hour Max Basal 1.85>>2.5 Target 110, Correct above 110 ICR 1:9>>1.8 Correction Factor 38>>34  Clinical: Medications: Lantus  8 units q am Medical History: Type 2 Diabetes (2016) Labs: A1c 13.8% on 08/27/2023, c-peptide 0.33 on 01/22/2018, negative autoantibodies (2016)  Dietary and Lifestyle History: Patient lives with her mother.  Her mother does the cooking.  Patient works at [employer].  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 Stress: good Sleep: fair  24 hr Recall:  First Meal:  Crispy Chicken Patty, eggs Snack:  none Second meal:  air fried chicken sandwich, chips Snack:  none Third meal:  skipped as it was late when she got home Snack:  none Beverages:  water     NUTRITION INTERVENTION  Nutrition education (E-1) on the following topics:   Initial Follow-up  [x]  []  Definition of Type 1 and Type 2 Diabetes and implications for pregnancy [x]  []  Why dietary management is important in controlling blood glucose [x]  [x]  Effects each nutrient has on blood glucose levels [x]  []  Simple carbohydrates vs complex carbohydrates [x]  []  Fluid intake [x]  [x]  Creating a balanced meal plan []  []  Carbohydrate counting  [x]  []  When to check blood glucose levels [x]  [x]  Proper blood glucose monitoring techniques []  []  Effect of stress and stress  reduction techniques  []  []  Exercise effect on blood glucose levels, appropriate exercise during pregnancy []  []  Importance of limiting caffeine and abstaining from alcohol and smoking []  [x]  Medications used for blood sugar control during pregnancy []  []  Hypoglycemia and rule of 15 []  []  Postpartum self care  CGM:  Dexcom G7 - current sensor reading 110 and post meal 150-205  CGM Results from download: 11/13/2023 03/19/2023  % Time CGM active:    %   (Goal >70%) 91  Average glucose:   115 mg/dL for 14 days 865 x 14  Glucose management indicator:   6.1 % 6.5  Time in range (70-180 mg/dL):   93 %   (Goal >29%) 61  Time High (181-250 mg/dL):   4 %   (Goal < 74%) 24  Time Very High (>250 mg/dL):    0 %   (Goal < 5%) 14  Time Low (54-69 mg/dL):   2 %   (Goal <5%) 1  Time Very Low (<54 mg/dL):   <1 %   (Goal <8%) <1  %CV (glucose variability)     %  (Goal <36%) 29.3    Patient instructed to monitor glucose levels: FBS: 60 - <= 95 mg/dL; 2 hour: <= 879 mg/dL  Patient received handouts:  initial visit Pre-existing Diabetes and Pregnancy Carbohydrate Counting List Blood glucose log Snack ideas for diabetes during pregnancy How to Thrive:  A Guide for Your Journey with Diabetes by the ADA - will mail to patient  Patient will be seen for follow-up  as needed.

## 2024-03-18 NOTE — Telephone Encounter (Signed)
 Debbie from [facility] called & left message on nurse voicemail line stating the patient was referred for a fetal echo but has no showed twice counting today. They reached out to the patient and she reports needing afternoon appts but they are not open in the afternoon. They are closing the referral & recommend sending the patient to another facility.

## 2024-03-19 ENCOUNTER — Ambulatory Visit: Admitting: Obstetrics and Gynecology

## 2024-03-19 ENCOUNTER — Encounter: Payer: Self-pay | Admitting: Obstetrics and Gynecology

## 2024-03-19 VITALS — BP 137/80 | HR 89 | Wt 212.8 lb

## 2024-03-19 DIAGNOSIS — O98813 Other maternal infectious and parasitic diseases complicating pregnancy, third trimester: Secondary | ICD-10-CM

## 2024-03-19 DIAGNOSIS — O0993 Supervision of high risk pregnancy, unspecified, third trimester: Secondary | ICD-10-CM

## 2024-03-19 DIAGNOSIS — D563 Thalassemia minor: Secondary | ICD-10-CM | POA: Diagnosis not present

## 2024-03-19 DIAGNOSIS — A749 Chlamydial infection, unspecified: Secondary | ICD-10-CM

## 2024-03-19 DIAGNOSIS — O2343 Unspecified infection of urinary tract in pregnancy, third trimester: Secondary | ICD-10-CM

## 2024-03-19 DIAGNOSIS — O0990 Supervision of high risk pregnancy, unspecified, unspecified trimester: Secondary | ICD-10-CM

## 2024-03-19 DIAGNOSIS — O24113 Pre-existing diabetes mellitus, type 2, in pregnancy, third trimester: Secondary | ICD-10-CM | POA: Diagnosis not present

## 2024-03-19 DIAGNOSIS — O99013 Anemia complicating pregnancy, third trimester: Secondary | ICD-10-CM

## 2024-03-19 DIAGNOSIS — Z3A33 33 weeks gestation of pregnancy: Secondary | ICD-10-CM

## 2024-03-19 DIAGNOSIS — O99019 Anemia complicating pregnancy, unspecified trimester: Secondary | ICD-10-CM

## 2024-03-19 MED ORDER — NITROFURANTOIN MONOHYD MACRO 100 MG PO CAPS: 100.0000 mg | ORAL_CAPSULE | Freq: Every evening | ORAL | 1 refills | Status: AC

## 2024-03-19 NOTE — Progress Notes (Signed)
" ° °  PRENATAL VISIT NOTE  Subjective:  Christine [name] is a 24 y.o. G2P0010 at [redacted]w[redacted]d being seen today for ongoing prenatal care.  She is currently monitored for the following issues for this high-risk pregnancy and has Diabetes mellitus (HCC); Asthma; Hydradenitis; Supervision of high risk pregnancy, antepartum; Pre-existing type 2 diabetes mellitus during pregnancy; Alpha thalassemia silent carrier; Sickle cell trait in mother affecting pregnancy; Anemia during pregnancy; Iron deficiency anemia, unspecified; and Chlamydia infection affecting pregnancy in third trimester on their problem list.  Patient reports no complaints.  Contractions: Not present. Vag. Bleeding: None.  Movement: Present. Denies leaking of fluid.   The following portions of the patient's history were reviewed and updated as appropriate: allergies, current medications, past family history, past medical history, past social history, past surgical history and problem list.   Objective:   Vitals:   03/19/24 1104  BP: 137/80  Pulse: 89  Weight: 212 lb 12.8 oz (96.5 kg)    Fetal Status:  Fetal Heart Rate (bpm): 156   Movement: Present    General: Alert, oriented and cooperative. Patient is in no acute distress.  Skin: Skin is warm and dry. No rash noted.   Cardiovascular: Normal heart rate noted  Respiratory: Normal respiratory effort, no problems with respiration noted  Abdomen: Soft, gravid, appropriate for gestational age.  Pain/Pressure: Absent     Pelvic: Cervical exam deferred        Extremities: Normal range of motion.  Edema: Trace  Mental Status: Normal mood and affect. Normal behavior. Normal judgment and thought content.   Assessment and Plan:  Pregnancy: G2P0010 at [redacted]w[redacted]d 1. Supervision of high risk pregnancy, antepartum (Primary) BP and FHR normal   2. Pre-existing type 2 diabetes mellitus during pregnancy in third trimester Met with L. Jobe RD yesterday and made appropriate adjustments to omnipod.  1/7 u/s  AFI normal,  EFW 84%, bpp 8/8 Continue weekly testing Plan NST in our office next week Delivery timing pending sugars Has not had fetal echo   3. Sickle cell trait in mother affecting pregnancy 4. Alpha thalassemia silent carrier   5. [redacted] weeks gestation of pregnancy Still deciding on birth control Researching peds   6. Chlamydia infection affecting pregnancy in third trimester Chart reviewed, no previous chlamydia screen Picking up treatment today, discussed partner treatment  Swab at 36 weeks   7. 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  Dispense: 30 capsule; Refill: 1   Preterm labor symptoms and general obstetric precautions including but not limited to vaginal bleeding, contractions, leaking of fluid and fetal movement were reviewed in detail with the patient. Please refer to After Visit Summary for other counseling recommendations.   Return return 1 week NST then 2 weeks OB visit.  Future Appointments  Date Time Provider Department Center  03/24/2024  1:00 PM [facility] None  03/25/2024  2:00 PM [facility] NURSE [facility] None  03/26/2024  1:00 PM [facility] None  03/27/2024 12:40 PM [facility] US  2 [facility]  03/28/2024  1:00 PM [facility] None  03/31/2024  1:00 PM [facility] None  04/02/2024  1:00 PM [facility] None  04/03/2024  1:50 PM [place] Burnard HERO, MD [facility] None    Nidia Daring, FNP  "

## 2024-03-19 NOTE — Progress Notes (Signed)
 Pt presents for ROB visit. Pt was seen at MAU 03-13-24

## 2024-03-24 ENCOUNTER — Encounter (HOSPITAL_COMMUNITY)
Admission: RE | Admit: 2024-03-24 | Discharge: 2024-03-24 | Disposition: A | Discharge status: Home / Self Care | Source: Ambulatory Visit | Attending: Student | Admitting: Student

## 2024-03-24 VITALS — BP 130/76 | HR 92 | Temp 98.4°F | Resp 17

## 2024-03-24 DIAGNOSIS — D509 Iron deficiency anemia, unspecified: Secondary | ICD-10-CM | POA: Insufficient documentation

## 2024-03-24 DIAGNOSIS — O99019 Anemia complicating pregnancy, unspecified trimester: Secondary | ICD-10-CM | POA: Insufficient documentation

## 2024-03-24 DIAGNOSIS — Z3A00 Weeks of gestation of pregnancy not specified: Secondary | ICD-10-CM | POA: Diagnosis not present

## 2024-03-24 MED ORDER — IRON SUCROSE 200 MG IVPB - SIMPLE MED
Status: AC
  Filled 2024-03-24: qty 110

## 2024-03-24 MED ORDER — IRON SUCROSE 200 MG IVPB - SIMPLE MED
200.0000 mg | Freq: Once | Status: AC
  Administered 2024-03-24: 200 mg via INTRAVENOUS

## 2024-03-25 ENCOUNTER — Ambulatory Visit: Payer: Self-pay

## 2024-03-26 ENCOUNTER — Encounter (HOSPITAL_COMMUNITY)

## 2024-03-26 ENCOUNTER — Inpatient Hospital Stay (HOSPITAL_COMMUNITY)
Admission: RE | Admit: 2024-03-26 | Discharge: 2024-03-26 | Disposition: A | Source: Ambulatory Visit | Attending: Student

## 2024-03-26 VITALS — BP 137/81 | HR 88 | Temp 98.4°F | Resp 16

## 2024-03-26 DIAGNOSIS — O99019 Anemia complicating pregnancy, unspecified trimester: Secondary | ICD-10-CM

## 2024-03-26 DIAGNOSIS — D509 Iron deficiency anemia, unspecified: Secondary | ICD-10-CM

## 2024-03-26 MED ORDER — IRON SUCROSE 200 MG IVPB - SIMPLE MED
Status: AC
  Filled 2024-03-26: qty 110

## 2024-03-26 MED ORDER — IRON SUCROSE 200 MG IVPB - SIMPLE MED
200.0000 mg | Freq: Once | Status: AC
  Administered 2024-03-26: 200 mg via INTRAVENOUS

## 2024-03-27 ENCOUNTER — Inpatient Hospital Stay: Admission: RE | Admit: 2024-03-27

## 2024-03-28 ENCOUNTER — Encounter (HOSPITAL_COMMUNITY)
Admission: RE | Admit: 2024-03-28 | Discharge: 2024-03-28 | Disposition: A | Source: Ambulatory Visit | Attending: Student

## 2024-03-28 VITALS — BP 144/100 | HR 89 | Temp 98.6°F | Resp 16

## 2024-03-28 DIAGNOSIS — O99019 Anemia complicating pregnancy, unspecified trimester: Secondary | ICD-10-CM

## 2024-03-28 DIAGNOSIS — D509 Iron deficiency anemia, unspecified: Secondary | ICD-10-CM

## 2024-03-28 MED ORDER — IRON SUCROSE 200 MG IVPB - SIMPLE MED
200.0000 mg | Freq: Once | Status: AC
  Administered 2024-03-28: 200 mg via INTRAVENOUS

## 2024-03-28 MED ORDER — IRON SUCROSE 200 MG IVPB - SIMPLE MED
Status: AC
  Filled 2024-03-28: qty 110

## 2024-03-31 ENCOUNTER — Encounter (HOSPITAL_COMMUNITY)

## 2024-04-01 ENCOUNTER — Telehealth: Payer: Self-pay | Admitting: Dietician

## 2024-04-01 NOTE — Telephone Encounter (Signed)
 Called patient after reviewing her Dexcom G7 report as ported below.  Omnipod 5 pump settings She is using the pump in Automated mode most often. Sensor reading now was 155. Patient states that she remembers to bolus for all meals.  Reminded patient to aim to choose lower fat foods, increase vegetables, and watch the type and portion size of carbohydrates.   Pump settings today: and adjustments made Basal rate 0.9 units per hour Max Basal 2.5>>3 Target 110, Correct above 110 ICR 1:9>>1.8 Correction Factor 38>>34

## 2024-04-02 ENCOUNTER — Ambulatory Visit (HOSPITAL_COMMUNITY)
Admission: RE | Admit: 2024-04-02 | Discharge: 2024-04-02 | Disposition: A | Discharge status: Home / Self Care | Source: Ambulatory Visit | Attending: Student | Admitting: Student

## 2024-04-02 VITALS — BP 136/73 | HR 80 | Temp 98.2°F | Resp 16

## 2024-04-02 DIAGNOSIS — O99019 Anemia complicating pregnancy, unspecified trimester: Secondary | ICD-10-CM | POA: Diagnosis present

## 2024-04-02 DIAGNOSIS — D509 Iron deficiency anemia, unspecified: Secondary | ICD-10-CM | POA: Insufficient documentation

## 2024-04-02 MED ORDER — IRON SUCROSE 200 MG IVPB - SIMPLE MED
200.0000 mg | Freq: Once | Status: AC
  Administered 2024-04-02: 200 mg via INTRAVENOUS

## 2024-04-02 MED ORDER — IRON SUCROSE 200 MG IVPB - SIMPLE MED
Status: AC
  Filled 2024-04-02: qty 110

## 2024-04-03 ENCOUNTER — Ambulatory Visit: Payer: Self-pay | Admitting: Obstetrics and Gynecology

## 2024-04-03 ENCOUNTER — Encounter: Payer: Self-pay | Admitting: Obstetrics and Gynecology

## 2024-04-03 VITALS — BP 128/85 | HR 85 | Wt 223.8 lb

## 2024-04-03 DIAGNOSIS — O0990 Supervision of high risk pregnancy, unspecified, unspecified trimester: Secondary | ICD-10-CM

## 2024-04-03 DIAGNOSIS — O0993 Supervision of high risk pregnancy, unspecified, third trimester: Secondary | ICD-10-CM

## 2024-04-03 DIAGNOSIS — D509 Iron deficiency anemia, unspecified: Secondary | ICD-10-CM

## 2024-04-03 DIAGNOSIS — Z3A35 35 weeks gestation of pregnancy: Secondary | ICD-10-CM | POA: Diagnosis not present

## 2024-04-03 DIAGNOSIS — R030 Elevated blood-pressure reading, without diagnosis of hypertension: Secondary | ICD-10-CM | POA: Diagnosis not present

## 2024-04-03 DIAGNOSIS — O98813 Other maternal infectious and parasitic diseases complicating pregnancy, third trimester: Secondary | ICD-10-CM | POA: Diagnosis not present

## 2024-04-03 DIAGNOSIS — A749 Chlamydial infection, unspecified: Secondary | ICD-10-CM

## 2024-04-03 DIAGNOSIS — O24113 Pre-existing diabetes mellitus, type 2, in pregnancy, third trimester: Secondary | ICD-10-CM | POA: Diagnosis not present

## 2024-04-03 DIAGNOSIS — O99013 Anemia complicating pregnancy, third trimester: Secondary | ICD-10-CM

## 2024-04-03 DIAGNOSIS — D563 Thalassemia minor: Secondary | ICD-10-CM

## 2024-04-03 DIAGNOSIS — O99019 Anemia complicating pregnancy, unspecified trimester: Secondary | ICD-10-CM

## 2024-04-03 NOTE — Progress Notes (Signed)
 NST  DM2; pt has no log with her.   Pt has no concerns at this time.   BP in office today 134/90 and 128/85

## 2024-04-03 NOTE — Progress Notes (Addendum)
 "  PRENATAL VISIT NOTE  Subjective:  Christine [name] is a 24 y.o. G2P0010 at [redacted]w[redacted]d being seen today for ongoing prenatal care.  She is currently monitored for the following issues for this high-risk pregnancy and has Diabetes mellitus (HCC); Asthma; Hydradenitis; Supervision of high risk pregnancy, antepartum; Pre-existing type 2 diabetes mellitus during pregnancy; Alpha thalassemia silent carrier; Sickle cell trait in mother affecting pregnancy; Anemia during pregnancy; Iron  deficiency anemia, unspecified; and Chlamydia infection affecting pregnancy in third trimester on their problem list.  Patient reports no complaints.  Contractions: Not present. Vag. Bleeding: None.  Movement: Present. Denies leaking of fluid.   The following portions of the patient's history were reviewed and updated as appropriate: allergies, current medications, past family history, past medical history, past social history, past surgical history and problem list.   Objective:   Vitals:   04/03/24 1456 04/03/24 1502  BP: (!) 134/90 128/85  Pulse: 81 85  Weight: 223 lb 12.8 oz (101.5 kg)     Fetal Status:  Fetal Heart Rate (bpm): 143   Movement: Present    General: Alert, oriented and cooperative. Patient is in no acute distress.  Skin: Skin is warm and dry. No rash noted.   Cardiovascular: Normal heart rate noted  Respiratory: Normal respiratory effort, no problems with respiration noted  Abdomen: Soft, gravid, appropriate for gestational age.  Pain/Pressure: Absent     Pelvic: Cervical exam deferred        Extremities: Normal range of motion.  Edema: Trace  Mental Status: Normal mood and affect. Normal behavior. Normal judgment and thought content.      10/23/2023    1:36 PM 02/13/2023    3:15 PM 12/26/2022    4:08 PM  Depression screen PHQ 2/9  Decreased Interest 0 0 0  Down, Depressed, Hopeless 0 0 0  PHQ - 2 Score 0 0 0  Altered sleeping 0 0 0  Tired, decreased energy 0 0 0  Change in appetite 0 0 0   Feeling bad or failure about yourself  0 0 0  Trouble concentrating 0 0 0  Moving slowly or fidgety/restless 0 0 0  Suicidal thoughts 0 0 0  PHQ-9 Score 0  0  0   Difficult doing work/chores   Not difficult at all     Data saved with a previous flowsheet row definition        10/23/2023    1:36 PM  GAD 7 : Generalized Anxiety Score  Nervous, Anxious, on Edge 1   Control/stop worrying 0   Worry too much - different things 0   Trouble relaxing 0   Restless 0   Easily annoyed or irritable 1   Afraid - awful might happen 1   Total GAD 7 Score 3     Data saved with a previous flowsheet row definition    Assessment and Plan:  Pregnancy: G2P0010 at [redacted]w[redacted]d 1. Pre-existing type 2 diabetes mellitus during pregnancy in third trimester (Primary) FROM diabetes educator note 04/01/24 Pump settings today: and adjustments made Basal rate 0.9 units per hour Max Basal 2.5>>3 Target 110, Correct above 110 ICR 1:9>>1.8 Correction Factor 38>>34 -pt repots she is feeling okay, her basal rate was increased 2 days ago by diabetes educator up to 3 (report in note states CBGs 43% in range, 50% high and so settings increased) -pt reports her sugars are better over the last couple of days, numbers have not been as high NST reactive today Per MFM, plan for  delivery around 39 weeks Needs f/u MFM growth Needs weekly NST  2. Supervision of high risk pregnancy, antepartum  3. Alpha thalassemia silent carrier  4. Sickle cell trait in mother affecting pregnancy  5. Anemia during pregnancy  6. Chlamydia infection affecting pregnancy in third trimester TOC around 04/13/24  7. Iron  deficiency anemia, unspecified iron  deficiency anemia type Has IV iron  transfusion tomorrow, is last one  8. [redacted] weeks gestation of pregnancy - HIV antibody (with reflex) - RPR - Fetal nonstress test; Future - CBC - Comp Met (CMET) - Protein / creatinine ratio, urine  9. Elevated blood pressure reading in office  without diagnosis of hypertension Repeat normal Labs today Will follow up  Preterm labor symptoms and general obstetric precautions including but not limited to vaginal bleeding, contractions, leaking of fluid and fetal movement were reviewed in detail with the patient. Please refer to After Visit Summary for other counseling recommendations.   Return in about 1 week (around 04/10/2024) for high OB.  Future Appointments  Date Time Provider Department Center  04/04/2024  1:00 PM [facility] None    Burnard CHRISTELLA Moats, MD  "

## 2024-04-03 NOTE — Addendum Note (Signed)
 Addended by: LANG RIGGS A on: 04/03/2024 04:15 PM   Modules accepted: Orders

## 2024-04-03 NOTE — Addendum Note (Signed)
 Addended by: LANG RIGGS A on: 04/03/2024 03:53 PM   Modules accepted: Orders

## 2024-04-04 ENCOUNTER — Encounter (HOSPITAL_COMMUNITY)
Admission: RE | Admit: 2024-04-04 | Discharge: 2024-04-04 | Disposition: A | Source: Ambulatory Visit | Attending: Student

## 2024-04-04 VITALS — BP 137/81 | HR 87 | Temp 97.7°F | Resp 16

## 2024-04-04 DIAGNOSIS — O99019 Anemia complicating pregnancy, unspecified trimester: Secondary | ICD-10-CM

## 2024-04-04 DIAGNOSIS — D509 Iron deficiency anemia, unspecified: Secondary | ICD-10-CM

## 2024-04-04 LAB — COMPREHENSIVE METABOLIC PANEL WITH GFR
ALT: 9 [IU]/L (ref 0–32)
AST: 13 [IU]/L (ref 0–40)
Albumin: 3.2 g/dL — ABNORMAL LOW (ref 4.0–5.0)
Alkaline Phosphatase: 137 [IU]/L — ABNORMAL HIGH (ref 41–116)
BUN/Creatinine Ratio: 7 — ABNORMAL LOW (ref 9–23)
BUN: 4 mg/dL — ABNORMAL LOW (ref 6–20)
Bilirubin Total: <0.2 mg/dL (ref 0.0–1.2)
CO2: 21 mmol/L (ref 20–29)
Calcium: 8.9 mg/dL (ref 8.7–10.2)
Chloride: 104 mmol/L (ref 96–106)
Creatinine, Ser: 0.6 mg/dL (ref 0.57–1.00)
Globulin, Total: 3.2 g/dL (ref 1.5–4.5)
Glucose: 81 mg/dL (ref 70–99)
Potassium: 4.7 mmol/L (ref 3.5–5.2)
Sodium: 137 mmol/L (ref 134–144)
Total Protein: 6.4 g/dL (ref 6.0–8.5)
eGFR: 131 mL/min/{1.73_m2}

## 2024-04-04 LAB — CBC
Hematocrit: 30.9 % — ABNORMAL LOW (ref 34.0–46.6)
Hemoglobin: 9.2 g/dL — ABNORMAL LOW (ref 11.1–15.9)
MCH: 22.8 pg — ABNORMAL LOW (ref 26.6–33.0)
MCHC: 29.8 g/dL — ABNORMAL LOW (ref 31.5–35.7)
MCV: 77 fL — ABNORMAL LOW (ref 79–97)
NRBC: 1 % — ABNORMAL HIGH (ref 0–0)
Platelets: 439 10*3/uL (ref 150–450)
RBC: 4.04 x10E6/uL (ref 3.77–5.28)
RDW: 19.7 % — ABNORMAL HIGH (ref 11.7–15.4)
WBC: 9.5 10*3/uL (ref 3.4–10.8)

## 2024-04-04 LAB — SYPHILIS: RPR W/REFLEX TO RPR TITER AND TREPONEMAL ANTIBODIES, TRADITIONAL SCREENING AND DIAGNOSIS ALGORITHM: RPR Ser Ql: NONREACTIVE

## 2024-04-04 LAB — HIV ANTIBODY (ROUTINE TESTING W REFLEX): HIV Screen 4th Generation wRfx: NONREACTIVE

## 2024-04-04 LAB — PROTEIN / CREATININE RATIO, URINE
Creatinine, Urine: 87.5 mg/dL
Protein, Ur: 52 mg/dL
Protein/Creat Ratio: 594 mg/g{creat} — ABNORMAL HIGH (ref 0–200)

## 2024-04-04 MED ORDER — IRON SUCROSE 200 MG IVPB - SIMPLE MED
200.0000 mg | Freq: Once | Status: AC
  Administered 2024-04-04: 200 mg via INTRAVENOUS

## 2024-04-04 MED ORDER — IRON SUCROSE 200 MG IVPB - SIMPLE MED
Status: AC
  Filled 2024-04-04: qty 110

## 2024-04-07 ENCOUNTER — Encounter: Payer: Self-pay | Admitting: Obstetrics and Gynecology

## 2024-04-07 ENCOUNTER — Ambulatory Visit: Payer: Self-pay | Admitting: Obstetrics and Gynecology

## 2024-04-07 DIAGNOSIS — I10 Essential (primary) hypertension: Secondary | ICD-10-CM | POA: Insufficient documentation

## 2024-04-07 DIAGNOSIS — O1210 Gestational proteinuria, unspecified trimester: Secondary | ICD-10-CM | POA: Insufficient documentation

## 2024-04-10 ENCOUNTER — Encounter: Payer: Self-pay | Admitting: Obstetrics and Gynecology

## 2024-04-14 ENCOUNTER — Other Ambulatory Visit

## 2024-04-14 ENCOUNTER — Ambulatory Visit

## 2024-04-14 DIAGNOSIS — O99019 Anemia complicating pregnancy, unspecified trimester: Secondary | ICD-10-CM

## 2024-04-14 DIAGNOSIS — A749 Chlamydial infection, unspecified: Secondary | ICD-10-CM

## 2024-04-14 DIAGNOSIS — O0990 Supervision of high risk pregnancy, unspecified, unspecified trimester: Secondary | ICD-10-CM
